# Patient Record
Sex: Male | Born: 1960 | Hispanic: No | Marital: Married | Smoking: Current every day smoker
Health system: Southern US, Community
[De-identification: ages and names within clinical notes are randomized; demographics above are authoritative.]

## PROBLEM LIST (undated history)

## (undated) DIAGNOSIS — M549 Dorsalgia, unspecified: Secondary | ICD-10-CM

## (undated) DIAGNOSIS — G8929 Other chronic pain: Secondary | ICD-10-CM

---

## 2004-08-30 ENCOUNTER — Emergency Department (HOSPITAL_COMMUNITY): Admission: EM | Admit: 2004-08-30 | Discharge: 2004-08-30 | Payer: Self-pay | Admitting: Emergency Medicine

## 2004-09-05 ENCOUNTER — Emergency Department (HOSPITAL_COMMUNITY): Admission: EM | Admit: 2004-09-05 | Discharge: 2004-09-05 | Payer: Self-pay | Admitting: Emergency Medicine

## 2004-09-28 ENCOUNTER — Emergency Department (HOSPITAL_COMMUNITY): Admission: EM | Admit: 2004-09-28 | Discharge: 2004-09-28 | Payer: Self-pay | Admitting: Emergency Medicine

## 2006-06-09 ENCOUNTER — Emergency Department (HOSPITAL_COMMUNITY): Admission: EM | Admit: 2006-06-09 | Discharge: 2006-06-09 | Payer: Self-pay | Admitting: Emergency Medicine

## 2011-02-21 ENCOUNTER — Emergency Department (HOSPITAL_COMMUNITY)
Admission: EM | Admit: 2011-02-21 | Discharge: 2011-02-21 | Disposition: A | Discharge status: Home / Self Care | Payer: Self-pay | Attending: Emergency Medicine | Admitting: Emergency Medicine

## 2011-02-21 DIAGNOSIS — M545 Low back pain, unspecified: Secondary | ICD-10-CM | POA: Insufficient documentation

## 2011-02-21 DIAGNOSIS — M25559 Pain in unspecified hip: Secondary | ICD-10-CM | POA: Insufficient documentation

## 2011-02-21 DIAGNOSIS — M5430 Sciatica, unspecified side: Secondary | ICD-10-CM | POA: Insufficient documentation

## 2011-02-21 DIAGNOSIS — M79609 Pain in unspecified limb: Secondary | ICD-10-CM | POA: Insufficient documentation

## 2014-06-20 ENCOUNTER — Emergency Department (HOSPITAL_COMMUNITY): Payer: Worker's Compensation

## 2014-06-20 ENCOUNTER — Encounter (HOSPITAL_COMMUNITY): Payer: Self-pay | Admitting: Emergency Medicine

## 2014-06-20 ENCOUNTER — Emergency Department (HOSPITAL_COMMUNITY)
Admission: EM | Admit: 2014-06-20 | Discharge: 2014-06-20 | Disposition: A | Discharge status: Home / Self Care | Payer: Worker's Compensation | Attending: Emergency Medicine | Admitting: Emergency Medicine

## 2014-06-20 DIAGNOSIS — Z720 Tobacco use: Secondary | ICD-10-CM | POA: Insufficient documentation

## 2014-06-20 DIAGNOSIS — W1830XA Fall on same level, unspecified, initial encounter: Secondary | ICD-10-CM | POA: Insufficient documentation

## 2014-06-20 DIAGNOSIS — G8929 Other chronic pain: Secondary | ICD-10-CM | POA: Insufficient documentation

## 2014-06-20 DIAGNOSIS — Y998 Other external cause status: Secondary | ICD-10-CM | POA: Insufficient documentation

## 2014-06-20 DIAGNOSIS — S3992XA Unspecified injury of lower back, initial encounter: Secondary | ICD-10-CM | POA: Insufficient documentation

## 2014-06-20 DIAGNOSIS — Y9389 Activity, other specified: Secondary | ICD-10-CM | POA: Insufficient documentation

## 2014-06-20 DIAGNOSIS — M549 Dorsalgia, unspecified: Secondary | ICD-10-CM

## 2014-06-20 DIAGNOSIS — Y9289 Other specified places as the place of occurrence of the external cause: Secondary | ICD-10-CM | POA: Insufficient documentation

## 2014-06-20 HISTORY — DX: Other chronic pain: G89.29

## 2014-06-20 HISTORY — DX: Dorsalgia, unspecified: M54.9

## 2014-06-20 MED ORDER — OXYCODONE-ACETAMINOPHEN 5-325 MG PO TABS: 1.0000 | ORAL_TABLET | ORAL | Status: DC | PRN

## 2014-06-20 NOTE — ED Notes (Signed)
Pt states that he hurt his back in 2012 at work.  Injured it again 12/15.  States that he hasn't had insurance or a job and cannot get it taken care of.  Low back pain.

## 2014-06-20 NOTE — ED Provider Notes (Signed)
CSN: [id number]     Arrival date & time 06/20/14  4098 History   First MD Initiated Contact with Patient 06/20/14 1002     Chief Complaint  Patient presents with  . Back Pain     (Consider location/radiation/quality/duration/timing/severity/associated sxs/prior Treatment) Patient is a 54 y.o. male presenting with back pain. The history is provided by the patient and medical records.  Back Pain   This is a 54 year old male with past medical history significant for back issues, presenting to the ED for back pain. Patient states he has a long-standing history of recurrent injuries to his lumbar spine.  States his initial injury was in 2012 which was due to workers-comp claim, then injured it again in 2015. He states a few days ago he fell and his garage, no head injury or loss of consciousness. Patient states in the past he has been seen by a chiropractor with some relief, but no longer has insurance so this is not an option. Patient states current pain is localized to his right lower back and states this is more severe than before.  He denies radiation of pain into his legs but states he cannot stand unless he completely extends his right leg and rolls towards his left side.  He denies numbness, weakness, or paresthesias of his right leg.  Normal gait.  Patient has taken motrin PTA.  Past Medical History  Diagnosis Date  . Chronic back pain    History reviewed. No pertinent past surgical history. History reviewed. No pertinent family history. History  Substance Use Topics  . Smoking status: Current Every Day Smoker  . Smokeless tobacco: Not on file  . Alcohol Use: No    Review of Systems  Musculoskeletal: Positive for back pain.  All other systems reviewed and are negative.     Allergies  Review of patient's allergies indicates no known allergies.  Home Medications   Prior to Admission medications   Not on File   BP 96/61 mmHg  Pulse 87  Temp(Src) 98.3 F (36.8 C) (Oral)   Resp 17  SpO2 99%   Physical Exam  Constitutional: He is oriented to person, place, and time. He appears well-developed and well-nourished.  Thin but not cachectic  HENT:  Head: Normocephalic and atraumatic.  Mouth/Throat: Oropharynx is clear and moist.  Eyes: Conjunctivae and EOM are normal. Pupils are equal, round, and reactive to light.  Neck: Normal range of motion.  Cardiovascular: Normal rate, regular rhythm and normal heart sounds.   Pulmonary/Chest: Effort normal and breath sounds normal.  Abdominal: Soft. Bowel sounds are normal.  Musculoskeletal: Normal range of motion.       Lumbar back: He exhibits tenderness, bony tenderness and pain.       Back:  Tenderness of midline LS and right SI joint; no gross deformities noted; full ROM maintained but pain noted when bending forward; normal strength and sensation of BLE; normal gait  Neurological: He is alert and oriented to person, place, and time.  Skin: Skin is warm and dry.  Psychiatric: He has a normal mood and affect.  Nursing note and vitals reviewed.   ED Course  Procedures (including critical care time) Labs Review Labs Reviewed - No data to display  Imaging Review Dg Lumbar Spine Complete  06/20/2014   CLINICAL DATA:  Prior lumbar region trauma from fall and lifting ; recent exacerbation of pain. No recent injury.  EXAM: LUMBAR SPINE - COMPLETE 4+ VIEW  COMPARISON:  August 30, 2004  FINDINGS:  Frontal, lateral, spot lumbosacral lateral, and bilateral oblique views were obtained. There are 5 non-rib-bearing lumbar type vertebral bodies. There is mild lumbar levoscoliosis. There is no fracture or spondylolisthesis. There is moderate disc space narrowing at L4-5, progressed from prior study. Other disc spaces appear normal. There is no appreciable facet arthropathy.  IMPRESSION: Disc space narrowing at L4-5. No fracture or spondylolisthesis. Mild scoliosis.   Electronically Signed   By: Bretta BangWilliam  Woodruff M.D.   On: 06/20/2014  10:52     EKG Interpretation None      MDM   Final diagnoses:  Back pain, unspecified location   54 year old male with history of recurrent back injuries, presenting with back pain after falling in the garage. No head injury or loss of consciousness. On exam he has tenderness of his midline lumbar spine and right SI joint without bony deformity. X-ray obtained revealing progression of disc space narrowing at L4-L5.  No red flag sx or focal neurologic deficits on exam.  Patient will be started on percocet for pain control, will be referred to neurosurgery for follow-up.  Discussed plan with patient, he/she acknowledged understanding and agreed with plan of care.  Return precautions given for new or worsening symptoms.  Garlon HatchetLisa M Zyen Triggs, PA-C 06/20/14 1113  Juliet RudeNathan R. Rubin PayorPickering, MD 06/24/14 0900

## 2014-06-20 NOTE — Discharge Instructions (Signed)
Take the prescribed medication as directed. Follow-up with Dr. Shon BatonBrooks or [facility] neurosurgery-- call to schedule appt. Return to the ED for new or worsening symptoms.

## 2014-08-20 ENCOUNTER — Emergency Department (INDEPENDENT_AMBULATORY_CARE_PROVIDER_SITE_OTHER)
Admission: EM | Admit: 2014-08-20 | Discharge: 2014-08-20 | Disposition: A | Discharge status: Home / Self Care | Payer: Self-pay | Source: Home / Self Care | Attending: Family Medicine | Admitting: Family Medicine

## 2014-08-20 ENCOUNTER — Encounter (HOSPITAL_COMMUNITY): Payer: Self-pay

## 2014-08-20 DIAGNOSIS — M545 Low back pain, unspecified: Secondary | ICD-10-CM

## 2014-08-20 MED ORDER — CYCLOBENZAPRINE HCL 10 MG PO TABS: 10.0000 mg | ORAL_TABLET | Freq: Every evening | ORAL | Status: DC | PRN

## 2014-08-20 MED ORDER — PREDNISONE 10 MG PO KIT: PACK | ORAL | Status: DC

## 2014-08-20 NOTE — Discharge Instructions (Signed)
Thank you for coming in today. °Come back or go to the emergency room if you notice new weakness new numbness problems walking or bowel or bladder problems. ° ° °Back Exercises °These exercises may help you when beginning to rehabilitate your injury. Your symptoms may resolve with or without further involvement from your physician, physical therapist or athletic trainer. While completing these exercises, remember:  °· Restoring tissue flexibility helps normal motion to return to the joints. This allows healthier, less painful movement and activity. °· An effective stretch should be held for at least 30 seconds. °· A stretch should never be painful. You should only feel a gentle lengthening or release in the stretched tissue. °STRETCH - Extension, Prone on Elbows  °· Lie on your stomach on the floor, a bed will be too soft. Place your palms about shoulder width apart and at the height of your head. °· Place your elbows under your shoulders. If this is too painful, stack pillows under your chest. °· Allow your body to relax so that your hips drop lower and make contact more completely with the floor. °· Hold this position for __________ seconds. °· Slowly return to lying flat on the floor. °Repeat __________ times. Complete this exercise __________ times per day.  °RANGE OF MOTION - Extension, Prone Press Ups  °· Lie on your stomach on the floor, a bed will be too soft. Place your palms about shoulder width apart and at the height of your head. °· Keeping your back as relaxed as possible, slowly straighten your elbows while keeping your hips on the floor. You may adjust the placement of your hands to maximize your comfort. As you gain motion, your hands will come more underneath your shoulders. °· Hold this position __________ seconds. °· Slowly return to lying flat on the floor. °Repeat __________ times. Complete this exercise __________ times per day.  °RANGE OF MOTION- Quadruped, Neutral Spine  °· Assume a hands  and knees position on a firm surface. Keep your hands under your shoulders and your knees under your hips. You may place padding under your knees for comfort. °· Drop your head and point your tail bone toward the ground below you. This will round out your low back like an angry cat. Hold this position for __________ seconds. °· Slowly lift your head and release your tail bone so that your back sags into a large arch, like an old horse. °· Hold this position for __________ seconds. °· Repeat this until you feel limber in your low back. °· Now, find your "sweet spot." This will be the most comfortable position somewhere between the two previous positions. This is your neutral spine. Once you have found this position, tense your stomach muscles to support your low back. °· Hold this position for __________ seconds. °Repeat __________ times. Complete this exercise __________ times per day.  °STRETCH - Flexion, Single Knee to Chest  °· Lie on a firm bed or floor with both legs extended in front of you. °· Keeping one leg in contact with the floor, bring your opposite knee to your chest. Hold your leg in place by either grabbing behind your thigh or at your knee. °· Pull until you feel a gentle stretch in your low back. Hold __________ seconds. °· Slowly release your grasp and repeat the exercise with the opposite side. °Repeat __________ times. Complete this exercise __________ times per day.  °STRETCH - Hamstrings, Standing °· Stand or sit and extend your right / left   leg, placing your foot on a chair or foot stool °· Keeping a slight arch in your low back and your hips straight forward. °· Lead with your chest and lean forward at the waist until you feel a gentle stretch in the back of your right / left knee or thigh. (When done correctly, this exercise requires leaning only a small distance.) °· Hold this position for __________ seconds. °Repeat __________ times. Complete this stretch __________ times per  day. °STRENGTHENING - Deep Abdominals, Pelvic Tilt  °· Lie on a firm bed or floor. Keeping your legs in front of you, bend your knees so they are both pointed toward the ceiling and your feet are flat on the floor. °· Tense your lower abdominal muscles to press your low back into the floor. This motion will rotate your pelvis so that your tail bone is scooping upwards rather than pointing at your feet or into the floor. °· With a gentle tension and even breathing, hold this position for __________ seconds. °Repeat __________ times. Complete this exercise __________ times per day.  °STRENGTHENING - Abdominals, Crunches  °· Lie on a firm bed or floor. Keeping your legs in front of you, bend your knees so they are both pointed toward the ceiling and your feet are flat on the floor. Cross your arms over your chest. °· Slightly tip your chin down without bending your neck. °· Tense your abdominals and slowly lift your trunk high enough to just clear your shoulder blades. Lifting higher can put excessive stress on the low back and does not further strengthen your abdominal muscles. °· Control your return to the starting position. °Repeat __________ times. Complete this exercise __________ times per day.  °STRENGTHENING - Quadruped, Opposite UE/LE Lift  °· Assume a hands and knees position on a firm surface. Keep your hands under your shoulders and your knees under your hips. You may place padding under your knees for comfort. °· Find your neutral spine and gently tense your abdominal muscles so that you can maintain this position. Your shoulders and hips should form a rectangle that is parallel with the floor and is not twisted. °· Keeping your trunk steady, lift your right hand no higher than your shoulder and then your left leg no higher than your hip. Make sure you are not holding your breath. Hold this position __________ seconds. °· Continuing to keep your abdominal muscles tense and your back steady, slowly return  to your starting position. Repeat with the opposite arm and leg. °Repeat __________ times. Complete this exercise __________ times per day. °Document Released: 05/21/2005 Document Revised: 07/26/2011 Document Reviewed: 08/15/2008 °[employer]® Patient Information ©2015 [employer]. This information is not intended to replace advice given to you by your health care provider. Make sure you discuss any questions you have with your health care provider. ° °

## 2014-08-20 NOTE — ED Provider Notes (Signed)
Taje Lewter is a 54 y.o. male who presents to Urgent Care today for right low back pain. Symptoms present off and on for years. Patient had a pain flare starting about a week ago. Pain is severe and located in the right low back and to buttocks. No radiating pain weakness or numbness bowel bladder dysfunction. He's tried ibuprofen which has not helped much. No fevers or chills vomiting or diarrhea.   Past Medical History  Diagnosis Date  . Chronic back pain    History reviewed. No pertinent past surgical history. History  Substance Use Topics  . Smoking status: Current Every Day Smoker  . Smokeless tobacco: Not on file  . Alcohol Use: No   ROS as above Medications: No current facility-administered medications for this encounter.   Current Outpatient Prescriptions  Medication Sig Dispense Refill  . cyclobenzaprine (FLEXERIL) 10 MG tablet Take 1 tablet (10 mg total) by mouth at bedtime as needed for muscle spasms. 20 tablet 0  . PredniSONE 10 MG KIT 12 day dose pack po 1 kit 0   No Known Allergies   Exam:  BP 103/67 mmHg  Pulse 68  Temp(Src) 97.9 F (36.6 C) (Oral)  Resp 18  SpO2 100% Gen: Well NAD HEENT: EOMI,  MMM Lungs: Normal work of breathing. CTABL Heart: RRR no MRG Abd: NABS, Soft. Nondistended, Nontender Exts: Brisk capillary refill, warm and well perfused.  Back: Nontender to spinal midline. Tender palpation right SI joint. Negative straight leg raise test bilaterally. Positive right-sided Corky Sox test negative left. Reflexes are equal and normal bilateral lower extremities. She will be strength is intact. Antalgic gait. Lumbar range of motion is significantly limited by pain.  No results found for this or any previous visit (from the past 24 hour(s)). No results found.  Assessment and Plan: 54 y.o. male with lumbago due to myofascial disruption. Treat with Flexeril and prednisone Dosepak.  Discussed warning signs or symptoms. Please see discharge  instructions. Patient expresses understanding.     Gregor Hams, MD 08/20/14 [phone]

## 2014-08-20 NOTE — ED Notes (Signed)
Patient here for chronic back pain due to a work related injury back in 2015 Patient has been seen numerous times at [facility] Currently taking ibuprofen Does not follow up regularly with any specialists

## 2014-08-28 ENCOUNTER — Ambulatory Visit: Payer: Self-pay | Attending: Internal Medicine | Admitting: Internal Medicine

## 2014-08-28 ENCOUNTER — Encounter: Payer: Self-pay | Admitting: Internal Medicine

## 2014-08-28 VITALS — BP 98/61 | HR 76 | Temp 97.8°F | Resp 16 | Ht 70.0 in | Wt 133.0 lb

## 2014-08-28 DIAGNOSIS — M5441 Lumbago with sciatica, right side: Secondary | ICD-10-CM | POA: Insufficient documentation

## 2014-08-28 MED ORDER — TRAMADOL HCL 50 MG PO TABS: 50.0000 mg | ORAL_TABLET | Freq: Three times a day (TID) | ORAL | Status: DC | PRN

## 2014-08-28 MED ORDER — CYCLOBENZAPRINE HCL 10 MG PO TABS: 10.0000 mg | ORAL_TABLET | Freq: Three times a day (TID) | ORAL | Status: DC | PRN

## 2014-08-28 NOTE — Progress Notes (Signed)
Pt is here to establish care. Pt was injured at his job on last December. He hurt his back and he is still in severe pain and he is taking ibuprofen everyday.

## 2014-08-28 NOTE — Progress Notes (Signed)
Patient ID: Kurt Gray, male   DOB: 19-Mar-1961, 54 y.o.   MRN: [id number]  [id number]:[id number]  [id number]  DOB - [birth year]  CC:  Chief Complaint  Patient presents with  . Establish Care       HPI: Kurt Gray is a 54 y.o. male here today to establish medical care. In 2012, patient reports that he fell in garage and again in 2015 he had a subsequent fall causing back injury. He was seen in the urgent care 7 days ago for worsening back pain. His pain is aggravated by prolonged standing and sitting. He reports that the pain is so severe it wakes him up from his sleep. He has tried several OTC pain relievers without relief. Ibuprofen previously helped his pain but now has no impact on severity of pain. Pain begins in his lower back and radiates down into his right buttock. He denies bowel or bladder dysfunction. He has been to a chiropractor in the past with 3 slipped disc.   Patient has No headache, No chest pain, No abdominal pain - No Nausea, No new weakness tingling or numbness, No Cough - SOB.  No Known Allergies Past Medical History  Diagnosis Date  . Chronic back pain    Current Outpatient Prescriptions on File Prior to Visit  Medication Sig Dispense Refill  . cyclobenzaprine (FLEXERIL) 10 MG tablet Take 1 tablet (10 mg total) by mouth at bedtime as needed for muscle spasms. (Patient not taking: Reported on 08/28/2014) 20 tablet 0  . PredniSONE 10 MG KIT 12 day dose pack po (Patient not taking: Reported on 08/28/2014) 1 kit 0   No current facility-administered medications on file prior to visit.   History reviewed. No pertinent family history. History   Social History  . Marital Status: Single    Spouse Name: N/A  . Number of Children: N/A  . Years of Education: N/A   Occupational History  . Not on file.   Social History Main Topics  . Smoking status: Current Every Day Smoker  . Smokeless tobacco: Not on file  . Alcohol Use: No  . Drug Use: No  . Sexual  Activity: Not on file   Other Topics Concern  . Not on file   Social History Narrative    Review of Systems: See HPI   Objective:   Filed Vitals:   08/28/14 0916  BP: 98/61  Pulse: 76  Temp: 97.8 F (36.6 C)  Resp: 16    Physical Exam  Constitutional: He is oriented to person, place, and time.  Cardiovascular: Normal rate, regular rhythm and normal heart sounds.   Pulmonary/Chest: Effort normal and breath sounds normal.  Abdominal: Soft. Bowel sounds are normal.  Musculoskeletal:       Lumbar back: He exhibits tenderness, pain and spasm.  Pain with laying flat on exam table   Neurological: He is alert and oriented to person, place, and time.  Skin: Skin is warm and dry.     No results found for: WBC, HGB, HCT, MCV, PLT No results found for: CREATININE, BUN, NA, K, CL, CO2  No results found for: HGBA1C Lipid Panel  No results found for: CHOL, TRIG, HDL, CHOLHDL, VLDL, LDLCALC     Assessment and plan:   Kurt Gray was seen today for establish care.  Diagnoses and all orders for this visit:  Right-sided low back pain with right-sided sciatica Orders: -     cyclobenzaprine (FLEXERIL) 10 MG tablet; Take 1 tablet (10 mg total) by mouth 3 (  three) times daily as needed for muscle spasms. -     traMADol (ULTRAM) 50 MG tablet; Take 1 tablet (50 mg total) by mouth every 8 (eight) hours as needed. Patient had xrays of lumbar spine in 06/2014 that shoed disc space narrowing at L4-5 and mild scoliosis. Once patient gets orange card I will place order for Orthopedics.    Return if symptoms worsen or fail to improve.  The patient was given clear instructions to go to ER or return to medical center if symptoms don't improve, worsen or new problems develop. The patient verbalized understanding. The patient was told to call to get lab results if they haven't heard anything in the next week.     Chari Manning, [facility] [phone] 08/28/2014, 9:32  AM

## 2014-08-28 NOTE — Patient Instructions (Signed)
Call back when you have orange card for Orthopedic referral

## 2014-10-31 ENCOUNTER — Encounter (HOSPITAL_COMMUNITY): Payer: Self-pay | Admitting: Emergency Medicine

## 2014-10-31 ENCOUNTER — Emergency Department (INDEPENDENT_AMBULATORY_CARE_PROVIDER_SITE_OTHER)
Admission: EM | Admit: 2014-10-31 | Discharge: 2014-10-31 | Disposition: A | Discharge status: Home / Self Care | Payer: Self-pay | Source: Home / Self Care | Attending: Family Medicine | Admitting: Family Medicine

## 2014-10-31 DIAGNOSIS — M5441 Lumbago with sciatica, right side: Secondary | ICD-10-CM

## 2014-10-31 DIAGNOSIS — M5136 Other intervertebral disc degeneration, lumbar region: Secondary | ICD-10-CM

## 2014-10-31 MED ORDER — KETOROLAC TROMETHAMINE 60 MG/2ML IM SOLN
60.0000 mg | Freq: Once | INTRAMUSCULAR | Status: AC
  Administered 2014-10-31: 60 mg via INTRAMUSCULAR

## 2014-10-31 MED ORDER — TRAMADOL HCL 50 MG PO TABS: ORAL_TABLET | ORAL | Status: DC

## 2014-10-31 MED ORDER — METHYLPREDNISOLONE ACETATE 80 MG/ML IJ SUSP
80.0000 mg | Freq: Once | INTRAMUSCULAR | Status: AC
  Administered 2014-10-31: 80 mg via INTRAMUSCULAR

## 2014-10-31 MED ORDER — KETOROLAC TROMETHAMINE 60 MG/2ML IM SOLN
INTRAMUSCULAR | Status: AC
  Filled 2014-10-31: qty 2

## 2014-10-31 MED ORDER — DICLOFENAC SODIUM 75 MG PO TBEC
75.0000 mg | DELAYED_RELEASE_TABLET | Freq: Two times a day (BID) | ORAL | Status: DC
Start: 2014-10-31 — End: 2015-06-03

## 2014-10-31 MED ORDER — METHYLPREDNISOLONE ACETATE 80 MG/ML IJ SUSP
INTRAMUSCULAR | Status: AC
  Filled 2014-10-31: qty 1

## 2014-10-31 NOTE — ED Provider Notes (Signed)
CSN: [id number]     Arrival date & time 10/31/14  1547 History   First MD Initiated Contact with Patient 10/31/14 1659     Chief Complaint  Patient presents with  . Back Pain   (Consider location/radiation/quality/duration/timing/severity/associated sxs/prior Treatment) HPI Comments: Patient is a 54 yo male who carries a history of Lumbar DDD diagnosed by xray's in February. He has been seen sporadically for ongoing back pain and sciatica. He has not obtained his orange card and has not been referred to an Orthopedic. This morning the pain in the right lowe back and right leg was "worse". He has not been able to stand or walk for long periods because of his pain. He denies weakness or numbness in the lower extremities. He denies loss of bowel or bladder control. He continues to smoke tobacco.   The history is provided by the patient.    Past Medical History  Diagnosis Date  . Chronic back pain    History reviewed. No pertinent past surgical history. History reviewed. No pertinent family history. History  Substance Use Topics  . Smoking status: Current Every Day Smoker  . Smokeless tobacco: Not on file  . Alcohol Use: No    Review of Systems  All other systems reviewed and are negative.   Allergies  Review of patient's allergies indicates no known allergies.  Home Medications   Prior to Admission medications   Medication Sig Start Date End Date Taking? Authorizing Provider  cyclobenzaprine (FLEXERIL) 10 MG tablet Take 1 tablet (10 mg total) by mouth 3 (three) times daily as needed for muscle spasms. 08/28/14   Lance Bosch, NP  diclofenac (VOLTAREN) 75 MG EC tablet Take 1 tablet (75 mg total) by mouth 2 (two) times daily. 10/31/14   Bjorn Pippin, PA-C  ibuprofen (ADVIL,MOTRIN) 400 MG tablet Take 400 mg by mouth every 6 (six) hours as needed.    Historical Provider, MD  PredniSONE 10 MG KIT 12 day dose pack po Patient not taking: Reported on 08/28/2014 08/20/14   Gregor Hams,  MD  traMADol Veatrice Bourbon) 50 MG tablet 1-2 tablets every 8 hours if needed for pain 10/31/14   Bjorn Pippin, PA-C   BP 95/58 mmHg  Pulse 54  Temp(Src) 98 F (36.7 C) (Oral)  Resp 20  SpO2 100% Physical Exam  Constitutional: He is oriented to person, place, and time. He appears well-developed and well-nourished. He appears distressed.  Non-toxic. Sitting uncomfortably on the exam table, with left hip raised.   Pulmonary/Chest: Effort normal.  Musculoskeletal:  No spasm. Decreased ROM in the lumbar spine in all planes. +SLR on the right. Motor and sensation intact to bilateral lower extremities  Neurological: He is alert and oriented to person, place, and time. He displays normal reflexes. He exhibits normal muscle tone. Coordination normal.  Skin: Skin is warm and dry. He is not diaphoretic.  Psychiatric: His behavior is normal.  Nursing note and vitals reviewed.   ED Course  Procedures (including critical care time) Labs Review Labs Reviewed - No data to display  Imaging Review No results found.   MDM   1. Right-sided low back pain with right-sided sciatica   2. Degenerative disc disease, lumbar    Most likely nerve root irritation/impingement without evidence of neuro deficit. He will ultimately need a MRI under care of Ortho. Acutely will provide Toradol $RemoveBeforeD'60mg'[id number]$  and DepoMedrol $RemoveBefore'80mg'[id number]$  IM. Tramadol for pain and daily NSAIDs .  He is instructed for smoking cessation. I have  provided him the ortho on call today to discuss a visit and payment plans.     Bjorn Pippin, PA-C 10/31/14 1733

## 2014-10-31 NOTE — Discharge Instructions (Signed)
Sciatica °Sciatica is pain, weakness, numbness, or tingling along the path of the sciatic nerve. The nerve starts in the lower back and runs down the back of each leg. The nerve controls the muscles in the lower leg and in the back of the knee, while also providing sensation to the back of the thigh, lower leg, and the sole of your foot. Sciatica is a symptom of another medical condition. For instance, nerve damage or certain conditions, such as a herniated disk or bone spur on the spine, pinch or put pressure on the sciatic nerve. This causes the pain, weakness, or other sensations normally associated with sciatica. Generally, sciatica only affects one side of the body. °CAUSES  °· Herniated or slipped disc. °· Degenerative disk disease. °· A pain disorder involving the narrow muscle in the buttocks (piriformis syndrome). °· Pelvic injury or fracture. °· Pregnancy. °· Tumor (rare). °SYMPTOMS  °Symptoms can vary from mild to very severe. The symptoms usually travel from the low back to the buttocks and down the back of the leg. Symptoms can include: °· Mild tingling or dull aches in the lower back, leg, or hip. °· Numbness in the back of the calf or sole of the foot. °· Burning sensations in the lower back, leg, or hip. °· Sharp pains in the lower back, leg, or hip. °· Leg weakness. °· Severe back pain inhibiting movement. °These symptoms may get worse with coughing, sneezing, laughing, or prolonged sitting or standing. Also, being overweight may worsen symptoms. °DIAGNOSIS  °Your caregiver will perform a physical exam to look for common symptoms of sciatica. He or she may ask you to do certain movements or activities that would trigger sciatic nerve pain. Other tests may be performed to find the cause of the sciatica. These may include: °· Blood tests. °· X-rays. °· Imaging tests, such as an MRI or CT scan. °TREATMENT  °Treatment is directed at the cause of the sciatic pain. Sometimes, treatment is not necessary  and the pain and discomfort goes away on its own. If treatment is needed, your caregiver may suggest: °· Over-the-counter medicines to relieve pain. °· Prescription medicines, such as anti-inflammatory medicine, muscle relaxants, or narcotics. °· Applying heat or ice to the painful area. °· Steroid injections to lessen pain, irritation, and inflammation around the nerve. °· Reducing activity during periods of pain. °· Exercising and stretching to strengthen your abdomen and improve flexibility of your spine. Your caregiver may suggest losing weight if the extra weight makes the back pain worse. °· Physical therapy. °· Surgery to eliminate what is pressing or pinching the nerve, such as a bone spur or part of a herniated disk. °HOME CARE INSTRUCTIONS  °· Only take over-the-counter or prescription medicines for pain or discomfort as directed by your caregiver. °· Apply ice to the affected area for 20 minutes, 3-4 times a day for the first 48-72 hours. Then try heat in the same way. °· Exercise, stretch, or perform your usual activities if these do not aggravate your pain. °· Attend physical therapy sessions as directed by your caregiver. °· Keep all follow-up appointments as directed by your caregiver. °· Do not wear high heels or shoes that do not provide proper support. °· Check your mattress to see if it is too soft. A firm mattress may lessen your pain and discomfort. °SEEK IMMEDIATE MEDICAL CARE IF:  °· You lose control of your bowel or bladder (incontinence). °· You have increasing weakness in the lower back, pelvis, buttocks,   or legs.  You have redness or swelling of your back.  You have a burning sensation when you urinate.  You have pain that gets worse when you lie down or awakens you at night.  Your pain is worse than you have experienced in the past.  Your pain is lasting longer than 4 weeks.  You are suddenly losing weight without reason. MAKE SURE YOU:  Understand these  instructions.  Will watch your condition.  Will get help right away if you are not doing well or get worse. Document Released: 04/27/2001 Document Revised: 11/02/2011 Document Reviewed: 09/12/2011 [facility] Patient Information 2015 [place], [place]. This information is not intended to replace advice given to you by your health care provider. Make sure you discuss any questions you have with your health care provider.      You may have nerve irritation in your spine. This causes pain into your leg. You need to see a Spine doctor, and I have provided you with a name. Please call them for an appt. The shots will help you. Also take the Diclofenac daily for now and use the Tramadol if needed for pain. You must stop smoking. This causes worsening back arthritis.

## 2014-10-31 NOTE — ED Notes (Signed)
Pt has had back pain since December r/t a work injury. Pt states that the pain is going down to his right leg

## 2015-04-24 ENCOUNTER — Emergency Department (HOSPITAL_COMMUNITY)
Admission: EM | Admit: 2015-04-24 | Discharge: 2015-04-24 | Disposition: A | Discharge status: Home / Self Care | Payer: Self-pay | Attending: Emergency Medicine | Admitting: Emergency Medicine

## 2015-04-24 DIAGNOSIS — M5441 Lumbago with sciatica, right side: Secondary | ICD-10-CM | POA: Insufficient documentation

## 2015-04-24 DIAGNOSIS — F17200 Nicotine dependence, unspecified, uncomplicated: Secondary | ICD-10-CM | POA: Insufficient documentation

## 2015-04-24 DIAGNOSIS — G8929 Other chronic pain: Secondary | ICD-10-CM | POA: Insufficient documentation

## 2015-04-24 MED ORDER — KETOROLAC TROMETHAMINE 30 MG/ML IJ SOLN
60.0000 mg | Freq: Once | INTRAMUSCULAR | Status: AC
  Administered 2015-04-24: 60 mg via INTRAMUSCULAR
  Filled 2015-04-24: qty 2

## 2015-04-24 MED ORDER — OXYCODONE-ACETAMINOPHEN 5-325 MG PO TABS
2.0000 | ORAL_TABLET | Freq: Once | ORAL | Status: AC
  Administered 2015-04-24: 2 via ORAL
  Filled 2015-04-24: qty 2

## 2015-04-24 MED ORDER — CYCLOBENZAPRINE HCL 10 MG PO TABS
10.0000 mg | ORAL_TABLET | Freq: Three times a day (TID) | ORAL | Status: DC | PRN
Start: 2015-04-24 — End: 2015-06-03

## 2015-04-24 MED ORDER — OXYCODONE-ACETAMINOPHEN 5-325 MG PO TABS: 1.0000 | ORAL_TABLET | ORAL | Status: DC | PRN

## 2015-04-24 NOTE — ED Provider Notes (Signed)
CSN: [id number]     Arrival date & time 04/24/15  0907 History   First MD Initiated Contact with Patient 04/24/15 0912     Chief Complaint  Patient presents with  . Back Pain    HPI  Remigio EisenmengerBouaziz Tabb is a 54 year old male with history of DDD of L4-L5 per xray on 06/20/2014 who presents with right sided low back pain. 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 He says the pain is sharp, located at his right lower back and radiates down his right leg with feelings of numbness and tingling. His pain is worse when sitting up, improved when standing straight or lying flat. He has tried Ibuprofen and Tylenol which provide minimal relief. He denies bowel/bladder incontinence, fever, chills. He was seen in Urgent Care in June with similar complaints and was given a Toradol and DepoMedrol shot which provided temporary relief.  Past Medical History  Diagnosis Date  . Chronic back pain    No past surgical history on file. No family history on file. Social History  Substance Use Topics  . Smoking status: Current Every Day Smoker  . Smokeless tobacco: Not on file  . Alcohol Use: No    Review of Systems  Constitutional: Negative for fever, chills and diaphoresis.  Respiratory: Negative for shortness of breath.   Cardiovascular: Negative for chest pain, palpitations and leg swelling.  Gastrointestinal: Negative for diarrhea.  Genitourinary:       No bowel/bladder incontinence.  Musculoskeletal: Positive for back pain.      Allergies  Review of patient's allergies indicates no known allergies.  Home Medications   Prior to Admission medications   Medication Sig Start Date End Date Taking? Authorizing Provider  ibuprofen (ADVIL,MOTRIN) 400 MG tablet Take 400 mg by mouth every 6 (six) hours as needed.   Yes Historical Provider, MD  cyclobenzaprine (FLEXERIL) 10 MG tablet Take 1 tablet (10 mg total) by mouth 3  (three) times daily as needed for muscle spasms. 04/24/15   Darreld McleanVishal Coolidge Gossard, MD  diclofenac (VOLTAREN) 75 MG EC tablet Take 1 tablet (75 mg total) by mouth 2 (two) times daily. Patient not taking: Reported on 04/24/2015 10/31/14   Riki SheerMichelle G Young, PA-C  oxyCODONE-acetaminophen (PERCOCET) 5-325 MG tablet Take 1 tablet by mouth every 4 (four) hours as needed for severe pain. 04/24/15   Darreld McleanVishal Rydell Wiegel, MD  traMADol (ULTRAM) 50 MG tablet 1-2 tablets every 8 hours if needed for pain Patient not taking: Reported on 04/24/2015 10/31/14   Dillard CannonMichelle G Young, PA-C   BP 105/65 mmHg  Pulse 58  Temp(Src) 98.5 F (36.9 C) (Oral)  Resp 20  SpO2 99% Physical Exam  Constitutional: He is oriented to person, place, and time.  Cardiovascular: Normal rate and regular rhythm.   No murmur heard. Pulmonary/Chest: Effort normal. He has no wheezes. He has no rales.  Abdominal: Soft. There is no tenderness.  Musculoskeletal:  Decreased ROM at lumbar spine due to pain.   Neurological: He is alert and oriented to person, place, and time.  Reflex Scores:      Patellar reflexes are 2+ on the right side and 2+ on the left side.      Achilles reflexes are 2+ on the right side and 2+ on the left side. Straight leg test positive on RLE. Decreased sensation at right toes, otherwise intact.    ED Course  Procedures (including critical  care time) Labs Review Labs Reviewed - No data to display  Imaging Review No results found. I have personally reviewed and evaluated these images and lab results as part of my medical decision-making.   EKG Interpretation None      MDM   Final diagnoses:  Right-sided low back pain with right-sided sciatica   54 year old male with history of DDD of L4-L5 per xray on 06/20/2014 who presents with right sided low back pain. No alarm symptoms for spinal cord compression/cauda equina syndrome. He has history of DDD at L4-L5 lumbar spine with similar episodes in the past. He does not have  insurance or orange card which is a barrier to his medical care. He established as a [facility] patient at the [facility] in April 2016 and will need [facility] Card for Orthopedics referral.  Patient's symptoms consistent with flare up of right lumbar pain and sciatica. Given IM Toradol and po Percocet for pain which provided relief. Patient able to stand and ambulate prior to discharge. He is given application material for the [employer] and advised to follow up at the [facility]. Prescribed short course of Flexeril and Percocet.  Darreld Mclean, MD 04/24/15 1116  Pricilla Loveless, MD 04/26/15 [phone]

## 2015-04-24 NOTE — ED Notes (Signed)
[facility]-Pt. Coming from home c/o severe back pain. Pt. sts pain worsening and shooting down right leg. Pt.  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 Pt. sts his pain started worsening around 3am last  Night and he hasn't been able to sleep or get comfortable.

## 2015-04-24 NOTE — Discharge Instructions (Signed)
Please [facility] and [facility] with [facility].  Sciatica Sciatica is pain, weakness, numbness, or tingling along your sciatic nerve. The nerve starts in the lower back and runs down the back of each leg. Nerve damage or certain conditions pinch or put pressure on the sciatic nerve. This causes the pain, weakness, and other discomforts of sciatica. HOME CARE   Only take medicine as told by your doctor.  Apply ice to the affected area for 20 minutes. Do this 3-4 times a day for the first 48-72 hours. Then try heat in the same way.  Exercise, stretch, or do your usual activities if these do not make your pain worse.  Go to physical therapy as told by your doctor.  Keep all doctor visits as told.  Do not wear high heels or shoes that are not supportive.  Get a firm mattress if your mattress is too soft to lessen pain and discomfort. GET HELP RIGHT AWAY IF:   You cannot control when you poop (bowel movement) or pee (urinate).  You have more weakness in your lower back, lower belly (pelvis), butt (buttocks), or legs.  You have redness or puffiness (swelling) of your back.  You have a burning feeling when you pee.  You have pain that gets worse when you lie down.  You have pain that wakes you from your sleep.  Your pain is worse than past pain.  Your pain lasts longer than 4 weeks.  You are suddenly losing weight without reason. MAKE SURE YOU:   Understand these instructions.  Will watch this condition.  Will get help right away if you are not doing well or get worse.   This information is not intended to replace advice given to you by your health care provider. Make sure you discuss any questions you have with your health care provider.   Document Released: 02/10/2008 Document Revised: 01/22/2015 Document Reviewed: 09/12/2011 Elsevier Interactive Patient Education [employer]2016 Elsevier Inc.

## 2015-06-03 ENCOUNTER — Ambulatory Visit: Payer: Self-pay | Attending: Family Medicine | Admitting: Family Medicine

## 2015-06-03 ENCOUNTER — Encounter: Payer: Self-pay | Admitting: Family Medicine

## 2015-06-03 VITALS — BP 99/59 | HR 66 | Temp 98.1°F | Resp 13 | Ht 70.0 in | Wt 134.8 lb

## 2015-06-03 DIAGNOSIS — Z131 Encounter for screening for diabetes mellitus: Secondary | ICD-10-CM | POA: Insufficient documentation

## 2015-06-03 DIAGNOSIS — M545 Low back pain: Secondary | ICD-10-CM | POA: Insufficient documentation

## 2015-06-03 DIAGNOSIS — M5441 Lumbago with sciatica, right side: Secondary | ICD-10-CM | POA: Insufficient documentation

## 2015-06-03 LAB — POCT GLYCOSYLATED HEMOGLOBIN (HGB A1C): Hemoglobin A1C: 5.3

## 2015-06-03 MED ORDER — METHOCARBAMOL 500 MG PO TABS: 500.0000 mg | ORAL_TABLET | Freq: Four times a day (QID) | ORAL | Status: DC

## 2015-06-03 MED ORDER — TRAMADOL HCL 50 MG PO TABS: ORAL_TABLET | ORAL | Status: DC

## 2015-06-03 NOTE — Progress Notes (Signed)
Subjective:  Patient ID: Kurt Gray, male    DOB: 05-30-1960  Age: 55 y.o. 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 He states at the time he had radiation of the pain down his right lower extremity but now he has no radiation of pain but feels numb in his entire right leg, denies loss of sphincteric function. He complains of having to use a walker at home to maintain his stability as he has had a lot of falls. Complains of being unable to bend to tie his shoelace or put on his pants. 3 years prior to this incident he sustained a fall at his garage but states the back pain at the time was not disabling as it is now. Lumbar spine x-ray from 06/2014 revealed disc space narrowing at L4-L5 and scoliosis.  Was recently seen at [facility] ED, treated for sciatica and given a few Percocet pills which he has run out of.  Outpatient Prescriptions Prior to Visit  Medication Sig Dispense Refill  . ibuprofen (ADVIL,MOTRIN) 400 MG tablet Take 400 mg by mouth every 6 (six) hours as needed.    . cyclobenzaprine (FLEXERIL) 10 MG tablet Take 1 tablet (10 mg total) by mouth 3 (three) times daily as needed for muscle spasms. (Patient not taking: Reported on 06/03/2015) 15 tablet 0  . diclofenac (VOLTAREN) 75 MG EC tablet Take 1 tablet (75 mg total) by mouth 2 (two) times daily. (Patient not taking: Reported on 04/24/2015) 60 tablet 1  . oxyCODONE-acetaminophen (PERCOCET) 5-325 MG tablet Take 1 tablet by mouth every 4 (four) hours as needed for severe pain. (Patient not taking: Reported on 06/03/2015) 10 tablet 0  . traMADol (ULTRAM) 50 MG tablet 1-2 tablets every 8 hours if needed for pain (Patient not taking: Reported on 04/24/2015) 60 tablet 0   No facility-administered medications prior to visit.    ROS Review of  Systems  Constitutional: Negative for activity change and appetite change.  HENT: Negative for sinus pressure and sore throat.   Respiratory: Negative for chest tightness, shortness of breath and wheezing.   Cardiovascular: Negative for chest pain and palpitations.  Gastrointestinal: Negative for abdominal pain, constipation and abdominal distention.  Genitourinary: Negative.   Musculoskeletal:       See history of present illness  Psychiatric/Behavioral: Negative for behavioral problems and dysphoric mood.    Objective:  BP 99/59 mmHg  Pulse 66  Temp(Src) 98.1 F (36.7 C)  Resp 13  Ht  (1.778 m)  Wt 134 lb 12.8 oz (61.145 kg)  BMI 19.34 kg/m2  SpO2 100%  BP/Weight 06/03/2015 04/24/2015 10/31/2014  Systolic BP 99 105 95  Diastolic BP 59 65 58  Wt. (Lbs) 134.8 - -  BMI 19.34 - -     Physical Exam  Constitutional: He is oriented to person, place, and time. He appears well-developed and well-nourished.  Cardiovascular: Normal rate, normal heart sounds and intact distal pulses.   No murmur heard. Pulmonary/Chest: Effort normal and breath sounds normal. He has no wheezes. He has no rales. He exhibits no tenderness.  Abdominal: Soft. Bowel sounds are normal. He exhibits no distension and no mass. There is no tenderness.  Musculoskeletal: He exhibits tenderness (tenderness to palpation of right paraspinal region and on range of motion; positive straight  leg raise on the right and negative straight leg raise on the left.).  Neurological: He is alert and oriented to person, place, and time. He displays normal reflexes.    CLINICAL DATA: Prior lumbar region trauma from fall and lifting ; recent exacerbation of pain. No recent injury.  EXAM: LUMBAR SPINE - COMPLETE 4+ VIEW  COMPARISON: August 30, 2004  FINDINGS: Frontal, lateral, spot lumbosacral lateral, and bilateral oblique views were obtained. There are 5 non-rib-bearing lumbar type vertebral bodies. There is mild  lumbar levoscoliosis. There is no fracture or spondylolisthesis. There is moderate disc space narrowing at L4-5, progressed from prior study. Other disc spaces appear normal. There is no appreciable facet arthropathy.  IMPRESSION: Disc space narrowing at L4-5. No fracture or spondylolisthesis. Mild scoliosis.   Electronically Signed  By: Bretta Bang M.D.  On: 06/20/2014 10:52  Assessment & Plan:   1. Diabetes mellitus screening A1c is 5.3-normal - HgB A1c  2. Right-sided low back pain with right-sided sciatica X-ray reveals disc space narrowing and mild scoliosis. Due to prolonged duration of symptoms I would love to refer him for an MRI but at this time he is yet to obtain the [facility] discount Orange card which I have advised him to apply for; he will follow-up with his PCP as soon as he is approved so he can get further imaging or referral to orthopedic if indicated. Advised to apply heat and use back brace as tolerated. Also use assistive device if needed. - traMADol (ULTRAM) 50 MG tablet; 1-2 tablets every 8 hours if needed for pain  Dispense: 60 tablet; Refill: 0   Meds ordered this encounter  Medications  . traMADol (ULTRAM) 50 MG tablet    Sig: 1-2 tablets every 8 hours if needed for pain    Dispense:  60 tablet    Refill:  0  . DISCONTD: methocarbamol (ROBAXIN) 500 MG tablet    Sig: Take 1 tablet (500 mg total) by mouth 4 (four) times daily.    Dispense:  60 tablet    Refill:  1  . methocarbamol (ROBAXIN) 500 MG tablet    Sig: Take 1 tablet (500 mg total) by mouth 4 (four) times daily.    Dispense:  120 tablet    Refill:  1    Follow-up: Return in about 1 month (around 07/04/2015) for follow up of sciatica with PCP- Vikki Ports.   Jaclyn Shaggy MD

## 2015-06-03 NOTE — Progress Notes (Signed)
Patient had a work injury in 2015 and has had right lower quadrant back pain ever since He rates pain 9/10 constant in nature He cannot sleep at night and needs assistance getting up He needs to apply for [facility] discount.orange card

## 2015-06-03 NOTE — Patient Instructions (Signed)

## 2015-07-15 ENCOUNTER — Encounter: Payer: Self-pay | Admitting: Clinical

## 2015-07-15 NOTE — Progress Notes (Signed)
Depression screen [facility] 2/9 06/03/2015 08/28/2014  Decreased Interest 0 0  Down, Depressed, Hopeless 0 0  PHQ - 2 Score 0 0    GAD 7 : Generalized Anxiety Score 06/03/2015  Nervous, Anxious, on Edge 3  Control/stop worrying 3  Worry too much - different things 3  Trouble relaxing 3  Restless 3  Easily annoyed or irritable 3  Afraid - awful might happen 3  Total GAD 7 Score 21

## 2016-01-27 ENCOUNTER — Ambulatory Visit (HOSPITAL_COMMUNITY)
Admission: EM | Admit: 2016-01-27 | Discharge: 2016-01-27 | Disposition: A | Discharge status: Home / Self Care | Payer: Self-pay | Attending: Family Medicine | Admitting: Family Medicine

## 2016-01-27 DIAGNOSIS — M545 Low back pain, unspecified: Secondary | ICD-10-CM

## 2016-01-27 DIAGNOSIS — M549 Dorsalgia, unspecified: Secondary | ICD-10-CM

## 2016-01-27 DIAGNOSIS — T148XXA Other injury of unspecified body region, initial encounter: Secondary | ICD-10-CM

## 2016-01-27 DIAGNOSIS — G8929 Other chronic pain: Secondary | ICD-10-CM

## 2016-01-27 DIAGNOSIS — M62830 Muscle spasm of back: Secondary | ICD-10-CM

## 2016-01-27 DIAGNOSIS — T148 Other injury of unspecified body region: Secondary | ICD-10-CM

## 2016-01-27 MED ORDER — METHOCARBAMOL 500 MG PO TABS: 500.0000 mg | ORAL_TABLET | Freq: Two times a day (BID) | ORAL | 0 refills | Status: DC

## 2016-01-27 MED ORDER — KETOROLAC TROMETHAMINE 60 MG/2ML IM SOLN
INTRAMUSCULAR | Status: AC
  Filled 2016-01-27: qty 2

## 2016-01-27 MED ORDER — NAPROXEN 375 MG PO TABS: 375.0000 mg | ORAL_TABLET | Freq: Two times a day (BID) | ORAL | 0 refills | Status: DC

## 2016-01-27 MED ORDER — KETOROLAC TROMETHAMINE 60 MG/2ML IM SOLN
45.0000 mg | Freq: Once | INTRAMUSCULAR | Status: AC
  Administered 2016-01-27: 45 mg via INTRAMUSCULAR

## 2016-01-27 MED ORDER — TRAMADOL HCL 50 MG PO TABS: 50.0000 mg | ORAL_TABLET | Freq: Four times a day (QID) | ORAL | 0 refills | Status: DC | PRN

## 2016-01-27 NOTE — Discharge Instructions (Signed)
Heat and stretches. Meds as directed. No heavy lifting, pulling or other movements that make pain worse. Need to follow with a doctor

## 2016-01-27 NOTE — ED Provider Notes (Signed)
CSN: [id number]     Arrival date & time 01/27/16  1022 History   First MD Initiated Contact with Patient 01/27/16 1122     Chief Complaint  Patient presents with  . Back Pain   (Consider location/radiation/quality/duration/timing/severity/associated sxs/prior Treatment) 55 year old male immigrant is complaining of acute on chronic low back pain. He states the pain started approximate 2 years ago. It occurs maybe 2 or 3 times a week and is usually sharp and shooting will last a few minutes then go away. Yesterday the pain became rather severe. It is located across the lower lumbosacral musculature and worse on the right. Bending, stooping, pulling, prolonged sitting and rotation at the waist exacerbates the pain. There is no radiation of pain. No radicular pain. No lower extremity weakness.      Past Medical History:  Diagnosis Date  . Chronic back pain    No past surgical history on file. No family history on file. Social History  Substance Use Topics  . Smoking status: Current Every Day Smoker    Packs/day: 0.50  . Smokeless tobacco: Not on file  . Alcohol use No    Review of Systems  Constitutional: Negative.   Respiratory: Negative.   Gastrointestinal: Negative.   Genitourinary: Negative.   Musculoskeletal: Positive for back pain and myalgias. Negative for neck pain and neck stiffness.       As per HPI  Skin: Negative.   Neurological: Negative for dizziness, weakness, numbness and headaches.  All other systems reviewed and are negative.   Allergies  Review of patient's allergies indicates no known allergies.  Home Medications   Prior to Admission medications   Medication Sig Start Date End Date Taking? Authorizing Provider  ibuprofen (ADVIL,MOTRIN) 400 MG tablet Take 400 mg by mouth every 6 (six) hours as needed.    Historical Provider, MD  methocarbamol (ROBAXIN) 500 MG tablet Take 1 tablet (500 mg total) by mouth 2 (two) times daily. 01/27/16   Hayden Rasmussenavid Arlie Posch, NP   naproxen (NAPROSYN) 375 MG tablet Take 1 tablet (375 mg total) by mouth 2 (two) times daily. 01/27/16   Hayden Rasmussenavid Fields Oros, NP  naproxen (NAPROSYN) 375 MG tablet Take 1 tablet (375 mg total) by mouth 2 (two) times daily. 01/27/16   Hayden Rasmussenavid Christopherjohn Schiele, NP  traMADol (ULTRAM) 50 MG tablet Take 1 tablet (50 mg total) by mouth every 6 (six) hours as needed. 01/27/16   Hayden Rasmussenavid Vittorio Mohs, NP   Meds Ordered and Administered this Visit   Medications  ketorolac (TORADOL) injection 45 mg (not administered)    BP (!) 105/51 (BP Location: Left Arm) Comment: notified cma  Pulse 66   Temp 98.8 F (37.1 C) (Oral)   Resp 12   SpO2 100%  No data found.   Physical Exam  Constitutional: He is oriented to person, place, and time. He appears well-developed and well-nourished. No distress.  HENT:  Head: Normocephalic and atraumatic.  Eyes: EOM are normal.  Neck: Normal range of motion. Neck supple.  Cardiovascular: Normal rate.   Pulmonary/Chest: Effort normal.  Musculoskeletal: He exhibits tenderness. He exhibits no edema or deformity.  Tenderness across the lower most lumbosacral musculature. Greater tenderness on the right with there is a palpable nodular-like lesion most likely representing a muscle spasm. No tenderness to the spine. No palpable or percussion tenderness. No palpable deformity, swelling or discoloration. Extension of the lower extremities at the knee revealed slight weakness on the right only. Distal neurovascular and sensory is grossly intact.  Neurological: He is alert  and oriented to person, place, and time.  Skin: Skin is warm and dry.  Psychiatric: He has a normal mood and affect.  Nursing note and vitals reviewed.   Urgent Care Course   Clinical Course    Procedures (including critical care time)  Labs Review Labs Reviewed - No data to display  Imaging Review No results found.   Visual Acuity Review  Right Eye Distance:   Left Eye Distance:   Bilateral Distance:    Right Eye Near:    Left Eye Near:    Bilateral Near:         MDM   1. Bilateral low back pain without sciatica   2. Chronic back pain   3. Muscle strain   4. Muscle spasm of back    Heat and stretches. Meds as directed. No heavy lifting, pulling or other movements that make pain worse. Need to follow with a doctor Meds ordered this encounter  Medications  . ketorolac (TORADOL) injection 45 mg  . traMADol (ULTRAM) 50 MG tablet    Sig: Take 1 tablet (50 mg total) by mouth every 6 (six) hours as needed.    Dispense:  15 tablet    Refill:  0    Order Specific Question:   Supervising Provider    Answer:   Linna Hoff [phone]  . methocarbamol (ROBAXIN) 500 MG tablet    Sig: Take 1 tablet (500 mg total) by mouth 2 (two) times daily.    Dispense:  20 tablet    Refill:  0    Order Specific Question:   Supervising Provider    Answer:   Linna Hoff [phone]  . naproxen (NAPROSYN) 375 MG tablet    Sig: Take 1 tablet (375 mg total) by mouth 2 (two) times daily.    Dispense:  20 tablet    Refill:  0    Order Specific Question:   Supervising Provider    Answer:   Linna Hoff [phone]  . naproxen (NAPROSYN) 375 MG tablet    Sig: Take 1 tablet (375 mg total) by mouth 2 (two) times daily.    Dispense:  20 tablet    Refill:  0    Order Specific Question:   Supervising Provider    Answer:   Linna Hoff [[id number]]       Hayden Rasmussen, NP 01/27/16 1142

## 2016-01-27 NOTE — ED Triage Notes (Signed)
C/o back pain States injured back 2015 States he does heavy lifting daily Used ibuprofen as tx No pcp

## 2016-02-19 ENCOUNTER — Encounter (HOSPITAL_COMMUNITY): Payer: Self-pay | Admitting: Emergency Medicine

## 2016-02-19 ENCOUNTER — Ambulatory Visit (HOSPITAL_COMMUNITY)
Admission: EM | Admit: 2016-02-19 | Discharge: 2016-02-19 | Disposition: A | Discharge status: Home / Self Care | Payer: Self-pay | Attending: Internal Medicine | Admitting: Internal Medicine

## 2016-02-19 DIAGNOSIS — G8929 Other chronic pain: Secondary | ICD-10-CM

## 2016-02-19 DIAGNOSIS — M5441 Lumbago with sciatica, right side: Secondary | ICD-10-CM

## 2016-02-19 MED ORDER — PREDNISONE 50 MG PO TABS: 50.0000 mg | ORAL_TABLET | Freq: Every day | ORAL | 0 refills | Status: AC

## 2016-02-19 NOTE — ED Triage Notes (Signed)
Pt c/o persistent lower back pain onset 2015 related to work inj  Last seen here on 9/12 for similars sx.... Has been here in the past for same recurrent problem.   Pain increases w/activity  A&O x4... NAD

## 2016-02-19 NOTE — Discharge Instructions (Addendum)
Referral made to [facility] in [place].  You should receive a call if a few days to arrange an appointment.  Followup with primary care provider, Holland CommonsValerie Keck, as needed. Prescription for prednisone (for pain in back and down R leg) sent to [facility] today.

## 2016-02-26 NOTE — ED Provider Notes (Signed)
[facility]    CSN: [id number] Arrival date & time: 02/19/16  [id number]     History   Chief Complaint Chief Complaint  Patient presents with  . Back Pain    HPI Kurt Gray is a 55 y.o. male. He has chronic pain in his low back.  Various NSAIDs have been tried, without lasting improvement in pain.  Wonders if an MRI would be helpful.  Able to walk, no weakness/clumsiness in legs, no change in bowel/bladder function, no loss of sensation.  HPI  Past Medical History:  Diagnosis Date  . Chronic back pain     History reviewed. No pertinent surgical history.     Home Medications    Prior to Admission medications   Medication Sig Start Date End Date Taking? Authorizing Provider  methocarbamol (ROBAXIN) 500 MG tablet Take 1 tablet (500 mg total) by mouth 2 (two) times daily. 01/27/16  Yes Hayden Rasmussenavid Mabe, NP  naproxen (NAPROSYN) 375 MG tablet Take 1 tablet (375 mg total) by mouth 2 (two) times daily. 01/27/16  Yes Hayden Rasmussenavid Mabe, NP  naproxen (NAPROSYN) 375 MG tablet Take 1 tablet (375 mg total) by mouth 2 (two) times daily. 01/27/16  Yes Hayden Rasmussenavid Mabe, NP  traMADol (ULTRAM) 50 MG tablet Take 1 tablet (50 mg total) by mouth every 6 (six) hours as needed. 01/27/16  Yes Hayden Rasmussenavid Mabe, NP  ibuprofen (ADVIL,MOTRIN) 400 MG tablet Take 400 mg by mouth every 6 (six) hours as needed.    Historical Provider, MD    Family History History reviewed. No pertinent family history.  Social History Social History  Substance Use Topics  . Smoking status: Current Every Day Smoker    Packs/day: 0.50  . Smokeless tobacco: Never Used  . Alcohol use No     Allergies   Review of patient's allergies indicates no known allergies.   Review of Systems Review of Systems  All other systems reviewed and are negative.    Physical Exam Triage Vital Signs ED Triage Vitals  Enc Vitals Group     BP 02/19/16 1015 111/74     Pulse Rate 02/19/16 1015 60     Resp 02/19/16 1015 18     Temp 02/19/16  1015 98.1 F (36.7 C)     Temp Source 02/19/16 1015 Oral     SpO2 02/19/16 1015 99 %     Weight --      Height --      Head Circumference --      Peak Flow --      Pain Score 02/19/16 1017 10     Pain Loc --      Pain Edu? --      Excl. in GC? --    No data found.   Updated Vital Signs BP 111/74 (BP Location: Left Arm)   Pulse 60   Temp 98.1 F (36.7 C) (Oral)   Resp 18   SpO2 99%   Visual Acuity Right Eye Distance:   Left Eye Distance:   Bilateral Distance:    Right Eye Near:   Left Eye Near:    Bilateral Near:     Physical Exam  Constitutional: He is oriented to person, place, and time. No distress.  Alert, nicely groomed  HENT:  Head: Atraumatic.  Eyes:  Conjugate gaze, no eye redness/drainage  Neck: Neck supple.  Cardiovascular: Normal rate.   Pulmonary/Chest: No respiratory distress.  Abdominal: He exhibits no distension.  Musculoskeletal: Normal range of motion.  Neurological: He is  alert and oriented to person, place, and time.  Able to walk into the urgent care independently, climb on/off the exam table.  Skin: Skin is warm and dry.  No cyanosis  Nursing note and vitals reviewed.    UC Treatments / Results   Procedures Procedures (including critical care time)      None today  Final Clinical Impressions(s) / UC Diagnoses   Final diagnoses:  Chronic midline low back pain with right-sided sciatica   Referral made to [facility] in [place].  You should receive a call if a few days to arrange an appointment.  Followup with primary care provider, Holland Commons, as needed. Prescription for prednisone (for pain in back and down R leg) sent to [facility] today.  New Prescriptions Discharge Medication List as of 02/19/2016 10:53 AM    START taking these medications   Details  predniSONE (DELTASONE) 50 MG tablet Take 1 tablet (50 mg total) by mouth daily., Starting Thu 02/19/2016, Until Tue 02/24/2016,  Normal         Eustace Moore, MD 02/26/16 2046

## 2016-04-09 IMAGING — CR DG LUMBAR SPINE COMPLETE 4+V
5 series · 5 of 5 positions shown · non-contrast
Comparison: August 30, 2004

CLINICAL DATA: Prior lumbar region trauma from fall and lifting ;
recent exacerbation of pain. No recent injury.

EXAM:
LUMBAR SPINE - COMPLETE 4+ VIEW

[t lumbar spine ap]
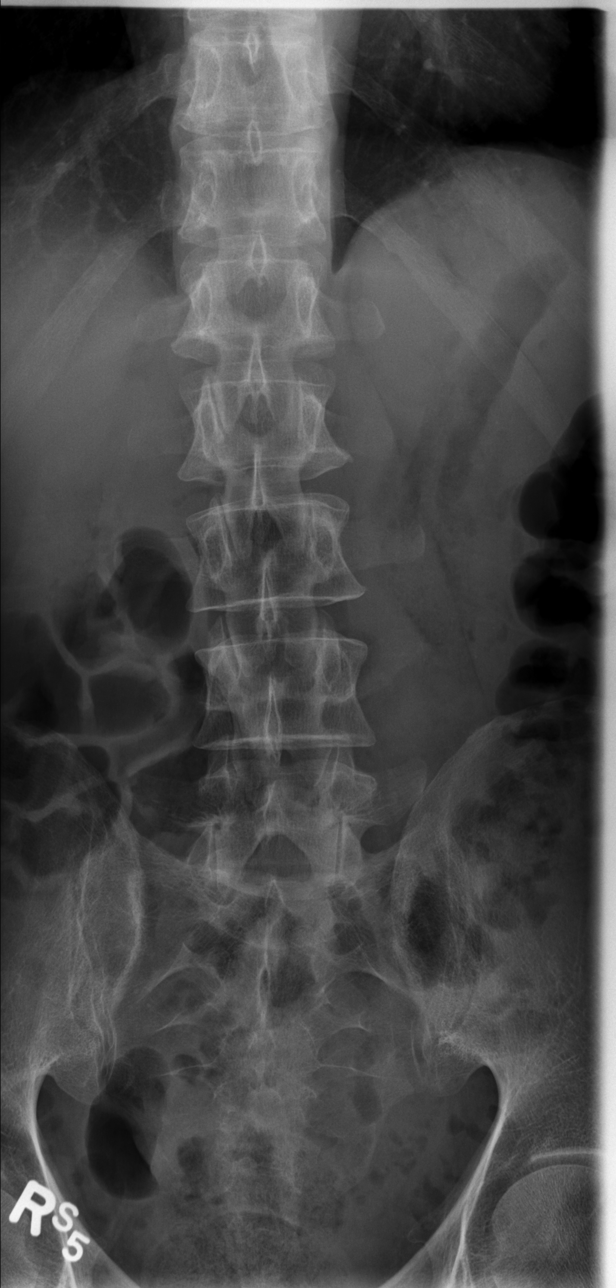

[t lumbar spine obl (1 of 2)]
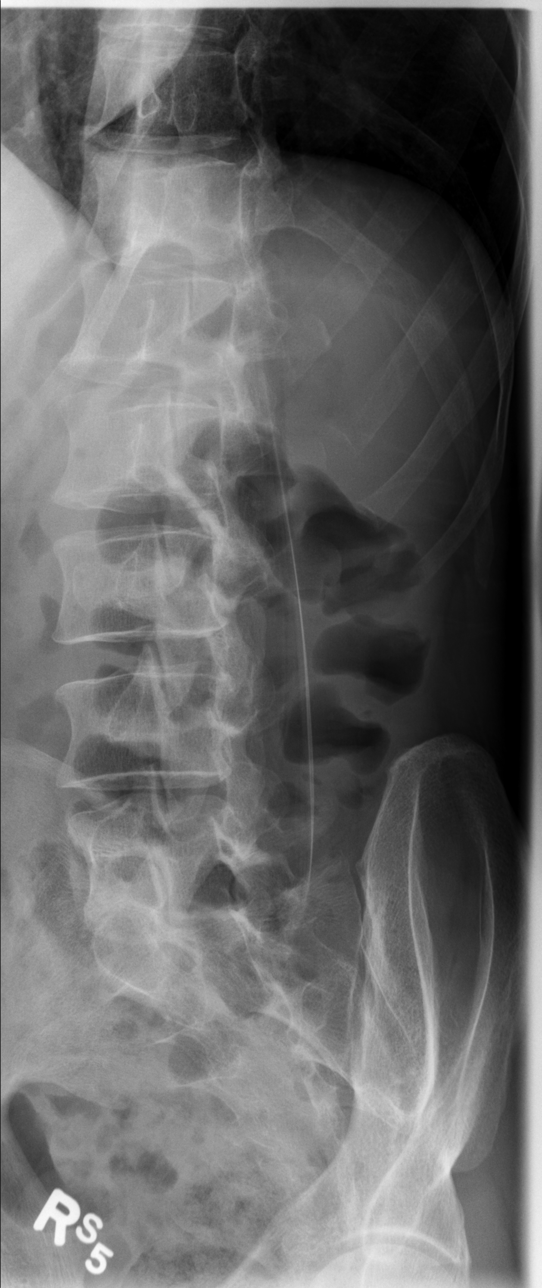

[t lumbar spine obl (2 of 2)]
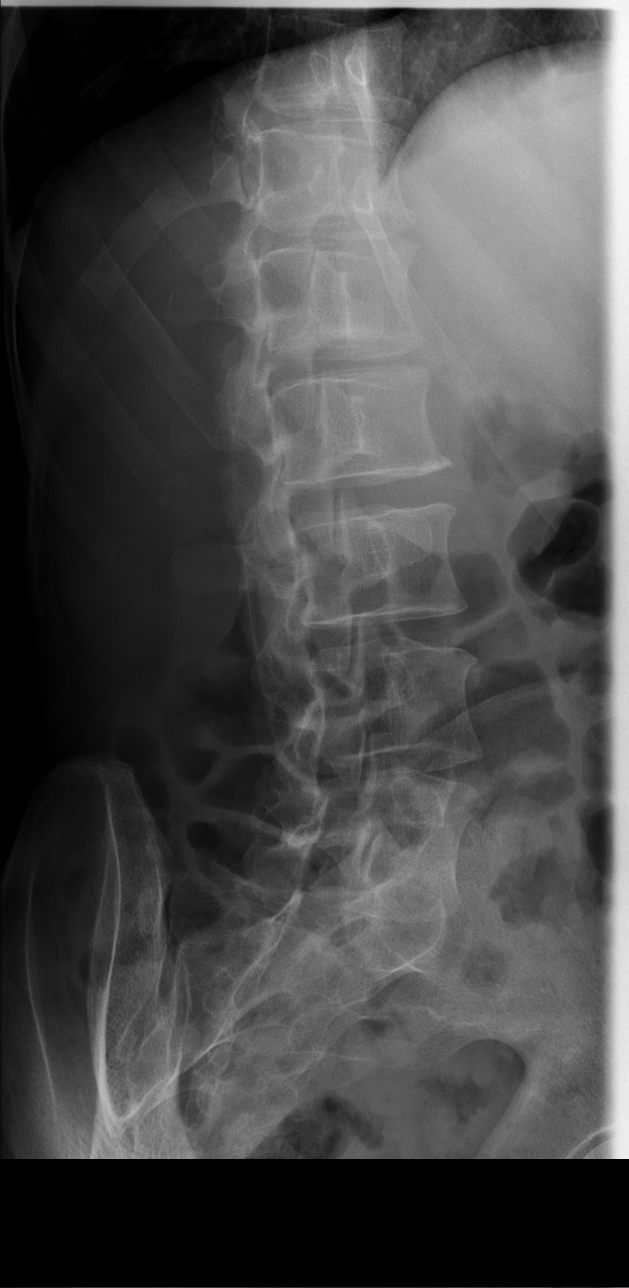

[t lumbar spine lat]
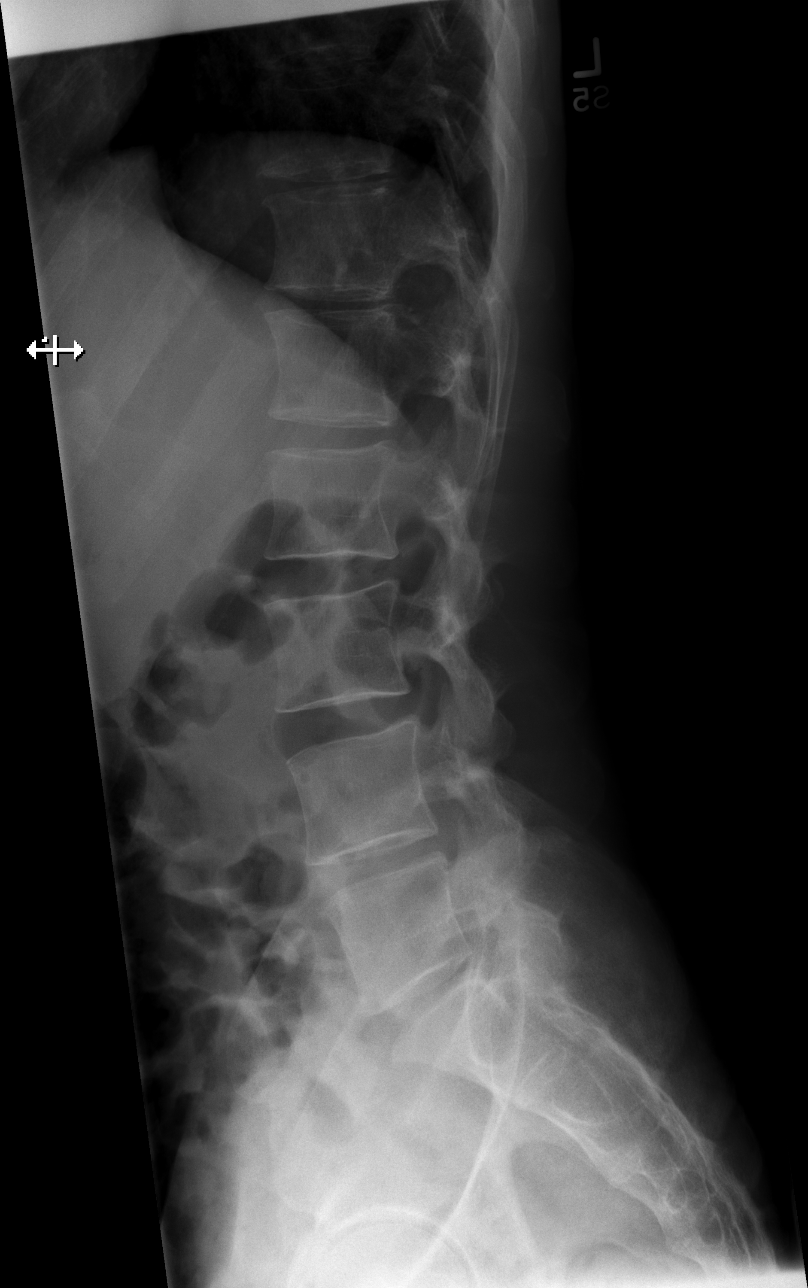

[t lumbar l-5 s-1 spot]
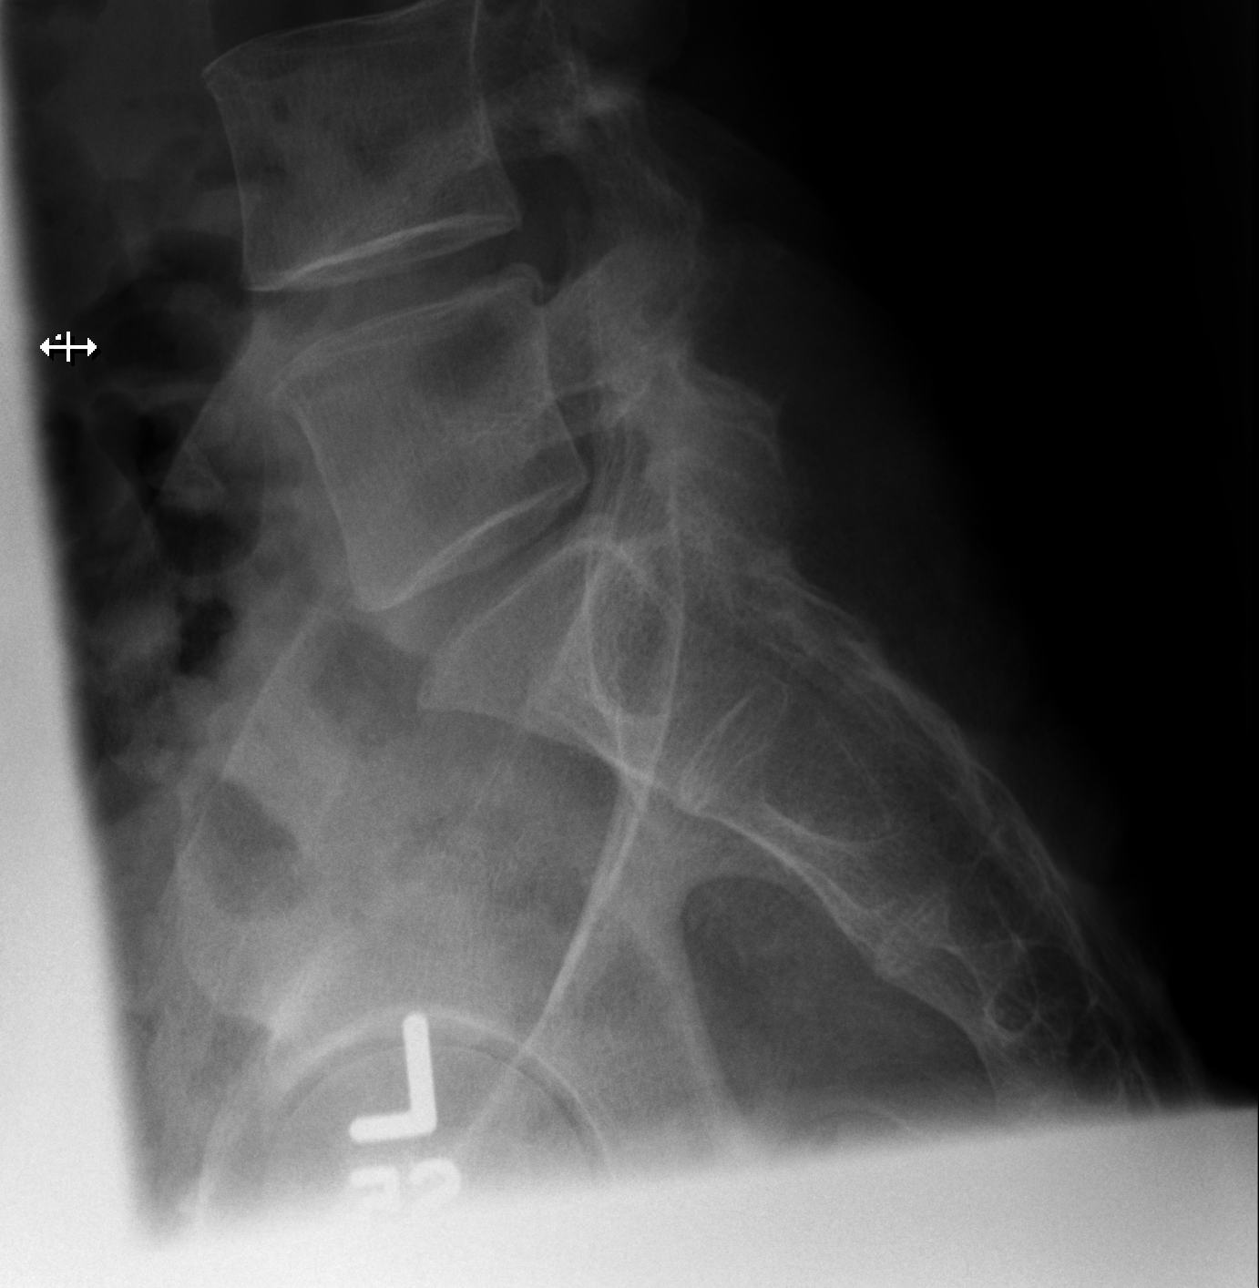

[5 of 5 positions shown; findings below may reference images not displayed]

FINDINGS: Frontal, lateral, spot lumbosacral lateral, and bilateral oblique
views were obtained. There are 5 non-rib-bearing lumbar type
vertebral bodies. There is mild lumbar levoscoliosis. There is no
fracture or spondylolisthesis. There is moderate disc space
narrowing at L4-5, progressed from prior study. Other disc spaces
appear normal. There is no appreciable facet arthropathy.
IMPRESSION: Disc space narrowing at L4-5. No fracture or spondylolisthesis. Mild
scoliosis.

## 2016-09-28 ENCOUNTER — Ambulatory Visit (HOSPITAL_COMMUNITY)
Admission: EM | Admit: 2016-09-28 | Discharge: 2016-09-28 | Disposition: A | Discharge status: Home / Self Care | Payer: Self-pay | Attending: Internal Medicine | Admitting: Internal Medicine

## 2016-09-28 ENCOUNTER — Encounter (HOSPITAL_COMMUNITY): Payer: Self-pay | Admitting: Emergency Medicine

## 2016-09-28 DIAGNOSIS — G8929 Other chronic pain: Secondary | ICD-10-CM

## 2016-09-28 DIAGNOSIS — M62830 Muscle spasm of back: Secondary | ICD-10-CM

## 2016-09-28 DIAGNOSIS — M545 Low back pain: Secondary | ICD-10-CM

## 2016-09-28 DIAGNOSIS — M5136 Other intervertebral disc degeneration, lumbar region: Secondary | ICD-10-CM

## 2016-09-28 MED ORDER — METHYLPREDNISOLONE ACETATE 80 MG/ML IJ SUSP
80.0000 mg | Freq: Once | INTRAMUSCULAR | Status: AC
  Administered 2016-09-28: 80 mg via INTRAMUSCULAR

## 2016-09-28 MED ORDER — METHYLPREDNISOLONE 4 MG PO TBPK: ORAL_TABLET | ORAL | 0 refills | Status: AC

## 2016-09-28 MED ORDER — METHOCARBAMOL 500 MG PO TABS: 500.0000 mg | ORAL_TABLET | Freq: Two times a day (BID) | ORAL | 0 refills | Status: AC

## 2016-09-28 MED ORDER — METHYLPREDNISOLONE ACETATE 80 MG/ML IJ SUSP
INTRAMUSCULAR | Status: AC
  Filled 2016-09-28: qty 1

## 2016-09-28 NOTE — Discharge Instructions (Signed)
Apply heat to the muscle area of the back. Take the medication as directed. Take with food. The methocarbamol medication is a muscle relaxant which can cause drowsiness. Do not drive or operate machinery that may cause injury. Follow with your primary care provider and back pain management providers for your back pain.

## 2016-09-28 NOTE — ED Triage Notes (Signed)
[facility] a [facility]. The patient stated that his PCP was unavailable and he is here for pain control.

## 2016-09-28 NOTE — ED Provider Notes (Addendum)
CSN: [id number]     Arrival date & time 09/28/16  [id number] History   First MD Initiated Contact with Patient 09/28/16 1024     Chief Complaint  Patient presents with  . Back Pain   (Consider location/radiation/quality/duration/timing/severity/associated sxs/prior Treatment) 56 year old male accompanied by his significant other with complaints of chronic low back pain. He has had chronic pain for at least 2 years. He apparently has discogenic pain involving nerve root encroachment. There is no change of character in the pain. It is located primarily to the right lower back and has a knifelike stabbing character particularly with flexion. Sometimes when he bends over is very difficult for him to stand back up. He is currently sitting on the table he appears restless and having pain in the right low back.      Past Medical History:  Diagnosis Date  . Chronic back pain    History reviewed. No pertinent surgical history. History reviewed. No pertinent family history. Social History  Substance Use Topics  . Smoking status: Current Every Day Smoker    Packs/day: 0.50  . Smokeless tobacco: Never Used  . Alcohol use No    Review of Systems  Constitutional: Positive for activity change. Negative for fever.  HENT: Negative.   Respiratory: Negative.   Gastrointestinal: Negative.   Musculoskeletal: Positive for back pain and myalgias. Negative for joint swelling, neck pain and neck stiffness.  Skin: Negative.   Neurological: Negative for speech difficulty, weakness and headaches.  All other systems reviewed and are negative.   Allergies  Patient has no known allergies.  Home Medications   Prior to Admission medications   Medication Sig Start Date End Date Taking? Authorizing Provider  ibuprofen (ADVIL,MOTRIN) 400 MG tablet Take 400 mg by mouth every 6 (six) hours as needed.   Yes [provider]  methocarbamol (ROBAXIN) 500 MG tablet Take 1 tablet (500 mg total) by mouth 2  (two) times daily. 09/28/16   Hayden RasmussenMabe, Hanadi Stanly, NP  methylPREDNISolone (MEDROL DOSEPAK) 4 MG TBPK tablet follow package directions. Take with food. Start 09/29/16. 09/28/16   Hayden RasmussenMabe, Milam Allbaugh, NP   Meds Ordered and Administered this Visit   Medications  methylPREDNISolone acetate (DEPO-MEDROL) injection 80 mg (not administered)    BP 96/65 (BP Location: Left Arm)   Pulse 71   Temp 98.1 F (36.7 C) (Oral)   Resp 16   SpO2 97%  No data found.   Physical Exam  Constitutional: He is oriented to person, place, and time. He appears well-developed and well-nourished.  HENT:  Head: Normocephalic and atraumatic.  Eyes: EOM are normal.  Neck: Normal range of motion. Neck supple.  Cardiovascular: Normal rate.   Pulmonary/Chest: Effort normal. No respiratory distress.  Musculoskeletal: He exhibits no edema.  Tenderness across the lower lumbosacral musculature. Tenderness to the far right paralumbar musculature. Movement exacerbates the pain. No good comfortable position will help. Lower extremity strength 5 over 5. No focal weakness or paresthesias.  Neurological: He is alert and oriented to person, place, and time.  Skin: Skin is warm and dry.  Nursing note and vitals reviewed.   Urgent Care Course     Procedures (including critical care time)  Labs Review Labs Reviewed - No data to display  Imaging Review No results found.   Visual Acuity Review  Right Eye Distance:   Left Eye Distance:   Bilateral Distance:    Right Eye Near:   Left Eye Near:    Bilateral Near:  MDM   1. Chronic right-sided low back pain without sciatica   2. Discogenic low back pain   3. Muscle spasm of back    Apply heat to the muscle area of the back. Take the medication as directed. Take with food. The methocarbamol medication is a muscle relaxant which can cause drowsiness. Do not drive or operate machinery that may cause injury. Follow with your primary care provider and back pain management  providers for your back pain.   CSRS reviewed.   Hayden Rasmussen, NP 09/28/16 1050    Hayden Rasmussen, NP 09/28/16 1050    Hayden Rasmussen, NP 10/06/16 1704

## 2018-07-27 ENCOUNTER — Ambulatory Visit (HOSPITAL_COMMUNITY)
Admission: EM | Admit: 2018-07-27 | Discharge: 2018-07-27 | Disposition: A | Discharge status: Home / Self Care | Payer: Medicare Other

## 2018-07-27 ENCOUNTER — Encounter (HOSPITAL_COMMUNITY): Payer: Self-pay

## 2018-07-27 ENCOUNTER — Other Ambulatory Visit: Payer: Self-pay

## 2018-07-27 DIAGNOSIS — N529 Male erectile dysfunction, unspecified: Secondary | ICD-10-CM

## 2018-07-27 NOTE — ED Triage Notes (Signed)
Pt has complaints of erectile dysfunction that he has had issues with for the past few months.  Pt has not established a primary care physician yet.

## 2018-07-27 NOTE — ED Notes (Signed)
Bed: UC07 Expected date:  Expected time:  Means of arrival:  Comments: Patient still in room

## 2018-07-27 NOTE — ED Provider Notes (Addendum)
  MRN: [id number] DOB: February 25, 1961  Subjective:   Kurt Gray is a 58 y.o. male presenting for consult on erectile dysfunction.  Patient presents with his wife and both report that he has had difficulty with this for the past 5 to 6 months.  His wife reports that this is a major problem and needs to be situated immediately.  She wanted to have lab work done, medications and referral placed to help with his erections.   No current facility-administered medications for this encounter.   Current Outpatient Medications:  .  ibuprofen (ADVIL,MOTRIN) 400 MG tablet, Take 400 mg by mouth every 6 (six) hours as needed., Disp: , Rfl:  .  methocarbamol (ROBAXIN) 500 MG tablet, Take 1 tablet (500 mg total) by mouth 2 (two) times daily., Disp: 20 tablet, Rfl: 0 .  methylPREDNISolone (MEDROL DOSEPAK) 4 MG TBPK tablet, follow package directions. Take with food. Start 09/29/16., Disp: 21 tablet, Rfl: 0   No Known Allergies  Past Medical History:  Diagnosis Date  . Chronic back pain     History reviewed. No pertinent surgical history.  ROS  Objective:   Vitals: BP (!) 112/56 (BP Location: Right Arm)   Pulse 74   Temp 98.6 F (37 C) (Oral)   Resp 18   SpO2 100%   Physical Exam Constitutional:      Appearance: Normal appearance. He is well-developed and normal weight.  HENT:     Head: Normocephalic and atraumatic.     Right Ear: External ear normal.     Left Ear: External ear normal.     Nose: Nose normal.     Mouth/Throat:     Pharynx: Oropharynx is clear.  Eyes:     Extraocular Movements: Extraocular movements intact.     Pupils: Pupils are equal, round, and reactive to light.  Cardiovascular:     Rate and Rhythm: Normal rate.  Pulmonary:     Effort: Pulmonary effort is normal.  Neurological:     Mental Status: He is alert and oriented to person, place, and time.  Psychiatric:        Mood and Affect: Mood normal.        Behavior: Behavior normal.    Assessment and Plan :    Erectile dysfunction, unspecified erectile dysfunction type  I counseled patient on possible sources for erectile dysfunction including but not limited to cholesterol, smoking, anxiety.  Counseled the work-up can include lab work, imaging and referrals.  I offered to place patient in PCP assistance so that they can start to work on finding a source for his erectile dysfunction.  Patient's wife refused this.  I was unable to provide patient with his visit summary as patient's wife left abruptly and the patient followed.  I discussed case with Dr. Leonides Grills.   Wallis Bamberg, [place] 07/27/18 1514

## 2018-12-01 ENCOUNTER — Ambulatory Visit: Payer: Medicare Other | Attending: Family Medicine | Admitting: Family Medicine

## 2018-12-01 ENCOUNTER — Other Ambulatory Visit: Payer: Self-pay

## 2018-12-01 ENCOUNTER — Encounter: Payer: Self-pay | Admitting: Family Medicine

## 2018-12-01 VITALS — BP 111/60 | HR 76 | Temp 98.1°F | Ht 67.0 in | Wt 118.6 lb

## 2018-12-01 DIAGNOSIS — Z720 Tobacco use: Secondary | ICD-10-CM | POA: Diagnosis not present

## 2018-12-01 DIAGNOSIS — F17210 Nicotine dependence, cigarettes, uncomplicated: Secondary | ICD-10-CM | POA: Insufficient documentation

## 2018-12-01 DIAGNOSIS — N529 Male erectile dysfunction, unspecified: Secondary | ICD-10-CM

## 2018-12-01 DIAGNOSIS — M545 Low back pain, unspecified: Secondary | ICD-10-CM

## 2018-12-01 DIAGNOSIS — G8929 Other chronic pain: Secondary | ICD-10-CM | POA: Insufficient documentation

## 2018-12-01 MED ORDER — SILDENAFIL CITRATE 100 MG PO TABS: 50.0000 mg | ORAL_TABLET | Freq: Every day | ORAL | 3 refills | Status: AC | PRN

## 2018-12-01 NOTE — Patient Instructions (Signed)
Health Risks of Smoking Smoking cigarettes is very bad for your health. Tobacco smoke has over 200 known poisons in it. It contains the poisonous gases nitrogen oxide and carbon monoxide. There are over 60 chemicals in tobacco smoke that cause cancer. Smoking is difficult to quit because a chemical in tobacco, called nicotine, causes addiction or dependence. When you smoke and inhale, nicotine is absorbed rapidly into the bloodstream through your lungs. Both inhaled and non-inhaled nicotine may be addictive. What are the risks of cigarette smoke? Cigarette smokers have an increased risk of many serious medical problems, including:  Lung cancer.  Lung disease, such as pneumonia, bronchitis, and emphysema.  Chest pain (angina) and heart attack because the heart is not getting enough oxygen.  Heart disease and peripheral blood vessel disease.  High blood pressure (hypertension).  Stroke.  Oral cancer, including cancer of the lip, mouth, or voice box.  Bladder cancer.  Pancreatic cancer.  Cervical cancer.  Pregnancy complications, including premature birth.  Stillbirths and smaller newborn babies, birth defects, and genetic damage to sperm.  Early menopause.  Lower estrogen level for women.  Infertility.  Facial wrinkles.  Blindness.  Increased risk of broken bones (fractures).  Senile dementia.  Stomach ulcers and internal bleeding.  Delayed wound healing and increased risk of complications during surgery.  Even smoking lightly shortens your life expectancy by several years. Because of secondhand smoke exposure, children of smokers have an increased risk of the following:  Sudden infant death syndrome (SIDS).  Respiratory infections.  Lung cancer.  Heart disease.  Ear infections. What are the benefits of quitting? There are many health benefits of quitting smoking. Here are some of them:  Within days of quitting smoking, your risk of having a heart attack  decreases, your blood flow improves, and your lung capacity improves. Blood pressure, pulse rate, and breathing patterns start returning to normal soon after quitting.  Within months, your lungs may clear up completely.  Quitting for 10 years reduces your risk of developing lung cancer and heart disease to almost that of a nonsmoker.  People who quit may see an improvement in their overall quality of life. How do I quit smoking?     Smoking is an addiction with both physical and psychological effects, and longtime habits can be hard to change. Your health care provider can recommend:  Programs and community resources, which may include group support, education, or talk therapy.  Prescription medicines to help reduce cravings.  Nicotine replacement products, such as patches, gum, and nasal sprays. Use these products only as directed. Do not replace cigarette smoking with electronic cigarettes, which are commonly called e-cigarettes. The safety of e-cigarettes is not known, and some may contain harmful chemicals.  A combination of two or more of these methods. Where to find more information  American Lung Association: www.lung.org  American Cancer Society: www.cancer.org Summary  Smoking cigarettes is very bad for your health. Cigarette smokers have an increased risk of many serious medical problems, including several cancers, heart disease, and stroke.  Smoking is an addiction with both physical and psychological effects, and longtime habits can be hard to change.  By stopping right away, you can greatly reduce the risk of medical problems for you and your family.  To help you quit smoking, your health care provider can recommend programs, community resources, prescription medicines, and nicotine replacement products such as patches, gum, and nasal sprays. This information is not intended to replace advice given to you by your health  care provider. Make sure you discuss any questions  you have with your health care provider. Document Released: 06/10/2004 Document Revised: 08/04/2017 Document Reviewed: 05/07/2016 Elsevier Patient Education  [place].  Erectile Dysfunction Erectile dysfunction (ED) is the inability to get or keep an erection in order to have sexual intercourse. Erectile dysfunction may include:  Inability to get an erection.  Lack of enough hardness of the erection to allow penetration.  Loss of the erection before sex is finished. What are the causes? This condition may be caused by:  Certain medicines, such as: ? Pain relievers. ? Antihistamines. ? Antidepressants. ? Blood pressure medicines. ? Water pills (diuretics). ? Ulcer medicines. ? Muscle relaxants. ? Drugs.  Excessive drinking.  Psychological causes, such as: ? Anxiety. ? Depression. ? Sadness. ? Exhaustion. ? Performance fear. ? Stress.  Physical causes, such as: ? Artery problems. This may include diabetes, smoking, liver disease, or atherosclerosis. ? High blood pressure. ? Hormonal problems, such as low testosterone. ? Obesity. ? Nerve problems. This may include back or pelvic injuries, diabetes mellitus, multiple sclerosis, or Parkinson disease. What are the signs or symptoms? Symptoms of this condition include:  Inability to get an erection.  Lack of enough hardness of the erection to allow penetration.  Loss of the erection before sex is finished.  Normal erections at some times, but with frequent unsatisfactory episodes.  Low sexual satisfaction in either partner due to erection problems.  A curved penis occurring with erection. The curve may cause pain or the penis may be too curved to allow for intercourse.  Never having nighttime erections. How is this diagnosed? This condition is often diagnosed by:  Performing a physical exam to find other diseases or specific problems with the penis.  Asking you detailed questions about the  problem.  Performing blood tests to check for diabetes mellitus or to measure hormone levels.  Performing other tests to check for underlying health conditions.  Performing an ultrasound exam to check for scarring.  Performing a test to check blood flow to the penis.  Doing a sleep study at home to measure nighttime erections. How is this treated? This condition may be treated by:  Medicine taken by mouth to help you achieve an erection (oral medicine).  Hormone replacement therapy to replace low testosterone levels.  Medicine that is injected into the penis. Your health care provider may instruct you how to give yourself these injections at home.  Vacuum pump. This is a pump with a ring on it. The pump and ring are placed on the penis and used to create pressure that helps the penis become erect.  Penile implant surgery. In this procedure, you may receive: ? An inflatable implant. This consists of cylinders, a pump, and a reservoir. The cylinders can be inflated with a fluid that helps to create an erection, and they can be deflated after intercourse. ? A semi-rigid implant. This consists of two silicone rubber rods. The rods provide some rigidity. They are also flexible, so the penis can both curve downward in its normal position and become straight for sexual intercourse.  Blood vessel surgery, to improve blood flow to the penis. During this procedure, a blood vessel from a different part of the body is placed into the penis to allow blood to flow around (bypass) damaged or blocked blood vessels.  Lifestyle changes, such as exercising more, losing weight, and quitting smoking. Follow these instructions at home: Medicines   Take over-the-counter and prescription medicines only  as told by your health care provider. Do not increase the dosage without first discussing it with your health care provider.  If you are using self-injections, perform injections as directed by your health  care provider. Make sure to avoid any veins that are on the surface of the penis. After giving an injection, apply pressure to the injection site for 5 minutes. General instructions  Exercise regularly, as directed by your health care provider. Work with your health care provider to lose weight, if needed.  Do not use any products that contain nicotine or tobacco, such as cigarettes and e-cigarettes. If you need help quitting, ask your health care provider.  Before using a vacuum pump, read the instructions that come with the pump and discuss any questions with your health care provider.  Keep all follow-up visits as told by your health care provider. This is important. Contact a health care provider if:  You feel nauseous.  You vomit. Get help right away if:  You are taking oral or injectable medicines and you have an erection that lasts longer than 4 hours. If your health care provider is unavailable, go to the nearest emergency room for evaluation. An erection that lasts much longer than 4 hours can result in permanent damage to your penis.  You have severe pain in your groin or abdomen.  You develop redness or severe swelling of your penis.  You have redness spreading up into your groin or lower abdomen.  You are unable to urinate.  You experience chest pain or a rapid heart beat (palpitations) after taking oral medicines. Summary  Erectile dysfunction (ED) is the inability to get or keep an erection during sexual intercourse. This problem can usually be treated successfully.  This condition is diagnosed based on a physical exam, your symptoms, and tests to determine the cause. Treatment varies depending on the cause, and may include medicines, hormone therapy, surgery, or vacuum pump.  You may need follow-up visits to make sure that you are using your medicines or devices correctly.  Get help right away if you are taking or injecting medicines and you have an erection that  lasts longer than 4 hours. This information is not intended to replace advice given to you by your health care provider. Make sure you discuss any questions you have with your health care provider. Document Released: 04/30/2000 Document Revised: 04/15/2017 Document Reviewed: 05/19/2016 Elsevier Patient Education  2020 [employer]

## 2018-12-01 NOTE — Progress Notes (Signed)
New Patient Office Visit  Subjective:  Patient ID: Kurt Gray, male    DOB: May 25, 1960  Age: 58 y.o. MRN: [id number]  CC:  Chief Complaint  Patient presents with  . Erectile Dysfunction    HPI Kurt Gray presents to establish care due to concerns regarding erectile dysfunction.  He reports no significant past medical issues other than chronic low back pain without radiation.  He started smoking cigarettes at about the age of 58 when he was in the [employer].  He reports that he continues to smoke but over the past few months has reduced his tobacco use to half a pack per day.  He states that someone suggested that perhaps his issues with erectile dysfunction might be associated with his tobacco use.  He reports several months of inability to maintain an erection and sometimes the inability to achieve an erection.  He reports that this is causing difficulties between himself and his wife.  He has never been on medication in the past to help with erectile dysfunction.  He states that overall he has been fairly healthy and does not really take medications on a regular basis.       He reports that his low back pain has been a chronic issue from a prior injury.  Back pain can be dull and sometimes stabbing and is usually about a 5 on a 0-to-10 scale with 0 being no pain and 10 being the worst pain imaginable.  Back pain actually improves when he is more active such as walking.  He denies any issues with urinary frequency, urgency or dysuria.  He has had no issues with incomplete bladder emptying, no nocturia and no straining to initiate his urinary stream.  He does not feel as if he has had any weakening of his urinary stream.  He does have family history of mother with diabetes but this was not until she was very old, possibly in her 6070s that she has now 7586.  Patient does have a brother here in this area who also has diabetes but developed this due to poor eating habits and lack of exercise  after a divorce.  Patient feels that overall he is healthy.  Past Medical History:  Diagnosis Date  . Chronic back pain     Past Surgical History:  Procedure Laterality Date  . NO PAST SURGERIES      Family History  Problem Relation Age of Onset  . Diabetes Mother   . Diabetes Brother     Social History   Tobacco Use  . Smoking status: Current Every Day Smoker    Packs/day: 0.50  . Smokeless tobacco: Never Used  Substance Use Topics  . Alcohol use: No  . Drug use: No    ROS Review of Systems  Constitutional: Negative for chills, fatigue and fever.  HENT: Negative for hearing loss, nosebleeds, sore throat and trouble swallowing.   Eyes: Negative for photophobia.  Respiratory: Negative for cough and shortness of breath.   Cardiovascular: Negative for chest pain, palpitations and leg swelling.  Gastrointestinal: Negative for abdominal distention, abdominal pain, anal bleeding, blood in stool, constipation, diarrhea, nausea, rectal pain and vomiting.  Endocrine: Negative for cold intolerance, heat intolerance, polydipsia, polyphagia and polyuria.  Genitourinary: Negative for difficulty urinating, dysuria, enuresis, flank pain, frequency, hematuria and testicular pain.  Musculoskeletal: Positive for back pain. Negative for arthralgias and gait problem.  Skin: Negative for rash and wound.  Neurological: Negative for dizziness and headaches.  Hematological: Negative for  adenopathy. Does not bruise/bleed easily.  Psychiatric/Behavioral: Negative for self-injury and suicidal ideas.    Objective:   Today's Vitals: BP 111/60   Pulse 76   Temp 98.1 F (36.7 C) (Oral)   Ht 5\' 7"  (1.702 m)   Wt 118 lb 9.6 oz (53.8 kg)   SpO2 99%   BMI 18.58 kg/m   Physical Exam  Assessment & Plan:  1. Tobacco use Discussed with the patient that his tobacco use may be contributing to his issues with erectile dysfunction.  Discussed the need for complete cessation of smoking.  Patient  was given educational material as part of his AVS regarding the health risks of smoking.  2. Erectile dysfunction, unspecified erectile dysfunction type Patient reports current issues with erectile dysfunction.  Due to patient's religious beliefs, patient will be referred to a male physician who specializes in urology for further evaluation and treatment of his erectile dysfunction.  Patient will have BMP to look for elevated glucose or electrolyte abnormality that may be a contributing factor.  Prescription provided for patient to try Viagra, 50 or 100 mg prior to intended sexual activity.  Attempted to order PSA as a screening test for prostate cancer however order was not able to be placed due to insurance issue.  This will likely be done by urology. - Basic Metabolic Panel - sildenafil (VIAGRA) 100 MG tablet; Take 0.5-1 tablets (50-100 mg total) by mouth daily as needed for erectile dysfunction.  Dispense: 10 tablet; Refill: 3 - Ambulatory referral to Urology  3. Low back pain without sciatica, unspecified back pain laterality, unspecified chronicity Patient reports that his back pain is a longstanding issue.  He reports that back pain generally responds to the use of over-the-counter medications.  Attempt was made to order PSA as a screening test for prostate cancer as well as in follow-up of patient's back pain however this order could not be placed and hopefully will be done when patient follows up with urology.  If he has worsening of back pain or any concerns, patient should return to clinic so that additional testing/x-rays can be ordered if needed.  Outpatient Encounter Medications as of 12/01/2018  Medication Sig  . ibuprofen (ADVIL,MOTRIN) 400 MG tablet Take 400 mg by mouth every 6 (six) hours as needed.  . methocarbamol (ROBAXIN) 500 MG tablet Take 1 tablet (500 mg total) by mouth 2 (two) times daily. (Patient not taking: Reported on 12/01/2018)  . methylPREDNISolone (MEDROL DOSEPAK) 4 MG  TBPK tablet follow package directions. Take with food. Start 09/29/16. (Patient not taking: Reported on 12/01/2018)  . sildenafil (VIAGRA) 100 MG tablet Take 0.5-1 tablets (50-100 mg total) by mouth daily as needed for erectile dysfunction.   No facility-administered encounter medications on file as of 12/01/2018.     Follow-up: Return if symptoms worsen or fail to improve, for consider annual well exam.   Antony Blackbird, MD

## 2018-12-02 LAB — BASIC METABOLIC PANEL WITH GFR
BUN/Creatinine Ratio: 14 (ref 9–20)
BUN: 13 mg/dL (ref 6–24)
CO2: 25 mmol/L (ref 20–29)
Calcium: 9.8 mg/dL (ref 8.7–10.2)
Chloride: 101 mmol/L (ref 96–106)
Creatinine, Ser: 0.92 mg/dL (ref 0.76–1.27)
GFR calc Af Amer: 106 mL/min/1.73
GFR calc non Af Amer: 91 mL/min/1.73
Glucose: 92 mg/dL (ref 65–99)
Potassium: 4.8 mmol/L (ref 3.5–5.2)
Sodium: 138 mmol/L (ref 134–144)

## 2018-12-08 ENCOUNTER — Telehealth: Payer: Self-pay

## 2018-12-08 NOTE — Telephone Encounter (Signed)
Kurt Gray [id number] with [place] interpreter called patient and informed him with what provider stated. Patient verbalized understanding.

## 2018-12-08 NOTE — Telephone Encounter (Signed)
-----   Message from Antony Blackbird, MD sent at 12/02/2018  8:26 PM EDT ----- Notify patient of normal basic metabolic panel

## 2018-12-08 NOTE — Telephone Encounter (Signed)
Patient was called and a voicemail was left informing patient to return phone call for lab results. 

## 2020-01-09 DIAGNOSIS — L03012 Cellulitis of left finger: Secondary | ICD-10-CM | POA: Diagnosis not present

## 2020-04-15 DIAGNOSIS — Z23 Encounter for immunization: Secondary | ICD-10-CM | POA: Diagnosis not present

## 2020-06-10 DIAGNOSIS — Z23 Encounter for immunization: Secondary | ICD-10-CM | POA: Diagnosis not present
